# Patient Record
Sex: Male | Born: 1975 | Race: White | Hispanic: No | Marital: Single | Smoking: Former smoker
Health system: Southern US, Community
[De-identification: ages and names within clinical notes are randomized; demographics above are authoritative.]

## PROBLEM LIST (undated history)

## (undated) DIAGNOSIS — F1911 Other psychoactive substance abuse, in remission: Secondary | ICD-10-CM

## (undated) HISTORY — PX: PNEUMONECTOMY: SHX168

## (undated) HISTORY — PX: ORIF DISTAL RADIUS FRACTURE: SUR927

## (undated) HISTORY — PX: FACIAL RECONSTRUCTION SURGERY: SHX631

---
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
## 2015-07-02 DIAGNOSIS — Z22322 Carrier or suspected carrier of Methicillin resistant Staphylococcus aureus: Secondary | ICD-10-CM

## 2015-07-02 HISTORY — DX: Carrier or suspected carrier of methicillin resistant Staphylococcus aureus: Z22.322

## 2020-03-22 ENCOUNTER — Ambulatory Visit
Admission: EM | Admit: 2020-03-22 | Discharge: 2020-03-22 | Disposition: A | Discharge status: Home / Self Care | Payer: Self-pay | Attending: Emergency Medicine | Admitting: Emergency Medicine

## 2020-03-22 ENCOUNTER — Other Ambulatory Visit: Payer: Self-pay

## 2020-03-22 ENCOUNTER — Ambulatory Visit (INDEPENDENT_AMBULATORY_CARE_PROVIDER_SITE_OTHER): Payer: Self-pay

## 2020-03-22 ENCOUNTER — Encounter: Payer: Self-pay | Admitting: Emergency Medicine

## 2020-03-22 DIAGNOSIS — S61432A Puncture wound without foreign body of left hand, initial encounter: Secondary | ICD-10-CM

## 2020-03-22 DIAGNOSIS — W298XXA Contact with other powered powered hand tools and household machinery, initial encounter: Secondary | ICD-10-CM

## 2020-03-22 DIAGNOSIS — M79642 Pain in left hand: Secondary | ICD-10-CM

## 2020-03-22 DIAGNOSIS — R2232 Localized swelling, mass and lump, left upper limb: Secondary | ICD-10-CM

## 2020-03-22 HISTORY — DX: Other psychoactive substance abuse, in remission: F19.11

## 2020-03-22 MED ORDER — IBUPROFEN 600 MG PO TABS: 600.0000 mg | ORAL_TABLET | Freq: Four times a day (QID) | ORAL | 0 refills | Status: DC | PRN

## 2020-03-22 MED ORDER — CEFTRIAXONE SODIUM 1 G IJ SOLR
1.0000 g | Freq: Once | INTRAMUSCULAR | Status: AC
  Administered 2020-03-22: 1 g via INTRAMUSCULAR

## 2020-03-22 MED ORDER — AMOXICILLIN-POT CLAVULANATE 875-125 MG PO TABS: 1.0000 | ORAL_TABLET | Freq: Two times a day (BID) | ORAL | 0 refills | Status: AC

## 2020-03-22 NOTE — Discharge Instructions (Addendum)
Your x-ray was negative for retained foreign body, however you clearly have an infection in the tissue.  I unsure how deep it goes.  I have given you a gram of Rocephin and we are going to try Augmentin which is an antibiotic.  May take 600 mg of ibuprofen combined with 1000 mg of Tylenol 3-4 times a day as needed for pain, please follow-up with Dr. Amanda Pea or any one of the hand surgeons at [facility] in several days if it does not get significantly better.  This may need to be incised and drained.

## 2020-03-22 NOTE — ED Provider Notes (Signed)
HPI  SUBJECTIVE:  Sean Rodriguez is a right-handed 45 y.o. male who presents with left webspace erythema, edema, pain between the thumb and left index finger starting 3 weeks ago.  States that he was drilling metal, the drill broke and went into his hand.  He reports a foreign body sensation, and a hard "knot" in this area.  No fevers, body aches, erythema streaking up the hand.  He reports limitation of motion of the thumb and index finger.  He tried opening it up 1 week ago, and got out pus.  He applied Neosporin and kept it covered.  This helped his symptoms.  He discontinued the antibiotic ointment and dressing 4 days ago, and his hand became swollen and red again.  It became painful 2 to 3 days ago.  States his symptoms feel worse than they did initially.  Symptoms are worse with palpation.  Tetanus is up-to-date.  Past medical history negative for diabetes, hypertension, smoking.  He has a past medical history of polysubstance abuse, has been clean for 4 months.  PMD: None.    Past Medical History:  Diagnosis Date  . History of substance abuse (HCC)     History reviewed. No pertinent surgical history.  History reviewed. No pertinent family history.  Social History   Tobacco Use  . Smoking status: Never Smoker  . Smokeless tobacco: Never Used  Substance Use Topics  . Alcohol use: Not Currently  . Drug use: Not Currently    No current facility-administered medications for this encounter.  Current Outpatient Medications:  .  amoxicillin-clavulanate (AUGMENTIN) 875-125 MG tablet, Take 1 tablet by mouth 2 (two) times daily for 7 days., Disp: 14 tablet, Rfl: 0 .  ibuprofen (ADVIL) 600 MG tablet, Take 1 tablet (600 mg total) by mouth every 6 (six) hours as needed., Disp: 30 tablet, Rfl: 0  No Known Allergies   ROS  As noted in HPI.   Physical Exam  BP 125/75 (BP Location: Left Arm)   Pulse 65   Temp 97.6 F (36.4 C) (Oral)   Resp 18   SpO2 97%   Constitutional: Well  developed, well nourished, no acute distress Eyes:  EOMI, conjunctiva normal bilaterally HENT: Normocephalic, atraumatic,mucus membranes moist Respiratory: Normal inspiratory effort Cardiovascular: Normal rate GI: nondistended skin: No rash, skin intact Musculoskeletal: Tender erythema, edema, firm mass in the webspace between the left thumb and index finger.  No tenderness along the palm of the hand or thenar eminence.  No erythema streaking up the hand.  No tenderness over the thumb, index finger.  Patient able to oppose thumb without any problem.       Neurologic: Alert & oriented x 3, no focal neuro deficits Psychiatric: Speech and behavior appropriate   ED Course   Medications  cefTRIAXone (ROCEPHIN) injection 1 g (1 g Intramuscular Given 03/22/20 1040)    Orders Placed This Encounter  Procedures  . DG Hand Complete Left    Standing Status:   Standing    Number of Occurrences:   1    Order Specific Question:   Reason for Exam (SYMPTOM  OR DIAGNOSIS REQUIRED)    Answer:   Function wound/infection in webspace between thumb and index finger-rule out retained foreign body    No results found for this or any previous visit (from the past 24 hour(s)). DG Hand Complete Left  Result Date: 03/22/2020 CLINICAL DATA:  Penetrating injury with drill bit 3 weeks ago. Pain, swelling, and drainage. Initial encounter. EXAM: LEFT HAND -  COMPLETE 3+ VIEW COMPARISON:  None. FINDINGS: There is no evidence of fracture or dislocation. There is no evidence of arthropathy or other focal bone abnormality. No evidence of radiopaque foreign body or soft tissue gas. IMPRESSION: Negative.  No evidence of fracture or radiopaque foreign body. Electronically Signed   By: Danae Orleans M.D.   On: 03/22/2020 10:55    ED Clinical Impression  1. Puncture wound of left hand without foreign body, initial encounter      ED Assessment/Plan  X-ray hands rule out retained foreign body.  Giving a gram of  Rocephin here.  Reviewed imaging independently and discussed with radiology.  No evidence of radiopaque foreign body or soft tissue gas.  See radiology report for full details.  Patient with cellulitis, possible abscess of the left hand.  Do not feel comfortable opening up and exploring.  Home with Augmentin, ibuprofen, follow-up with Dr. Amanda Pea or any of the hand surgeons in [facility] ASAP.    Discussed imaging, MDM, treatment plan, and plan for follow-up with patient. Discussed sn/sx that should prompt return to the ED. patient agrees with plan.   Meds ordered this encounter  Medications  . cefTRIAXone (ROCEPHIN) injection 1 g  . amoxicillin-clavulanate (AUGMENTIN) 875-125 MG tablet    Sig: Take 1 tablet by mouth 2 (two) times daily for 7 days.    Dispense:  14 tablet    Refill:  0  . ibuprofen (ADVIL) 600 MG tablet    Sig: Take 1 tablet (600 mg total) by mouth every 6 (six) hours as needed.    Dispense:  30 tablet    Refill:  0    *This clinic note was created using Scientist, clinical (histocompatibility and immunogenetics). Therefore, there may be occasional mistakes despite careful proofreading.   ?    Domenick Gong, MD 03/23/20 0900

## 2020-03-22 NOTE — ED Triage Notes (Signed)
Pt sts drill bit went into left hand 3 weeks ago; unsure if metal is still in hand but having some swelling pain and drainage per pt; pt is in sober living program

## 2020-05-15 ENCOUNTER — Other Ambulatory Visit: Payer: Self-pay

## 2020-05-15 ENCOUNTER — Encounter (HOSPITAL_COMMUNITY): Payer: Self-pay | Admitting: Emergency Medicine

## 2020-05-15 ENCOUNTER — Emergency Department (HOSPITAL_COMMUNITY): Payer: Self-pay

## 2020-05-15 ENCOUNTER — Emergency Department (HOSPITAL_COMMUNITY)
Admission: EM | Admit: 2020-05-15 | Discharge: 2020-05-15 | Disposition: A | Discharge status: Home / Self Care | Payer: Self-pay | Attending: Emergency Medicine | Admitting: Emergency Medicine

## 2020-05-15 DIAGNOSIS — S82892A Other fracture of left lower leg, initial encounter for closed fracture: Secondary | ICD-10-CM

## 2020-05-15 DIAGNOSIS — S82842A Displaced bimalleolar fracture of left lower leg, initial encounter for closed fracture: Secondary | ICD-10-CM | POA: Insufficient documentation

## 2020-05-15 DIAGNOSIS — Z8781 Personal history of (healed) traumatic fracture: Secondary | ICD-10-CM

## 2020-05-15 DIAGNOSIS — W19XXXA Unspecified fall, initial encounter: Secondary | ICD-10-CM | POA: Insufficient documentation

## 2020-05-15 DIAGNOSIS — Y9368 Activity, volleyball (beach) (court): Secondary | ICD-10-CM | POA: Insufficient documentation

## 2020-05-15 MED ORDER — HYDROMORPHONE HCL 1 MG/ML IJ SOLN
1.0000 mg | Freq: Once | INTRAMUSCULAR | Status: AC
  Administered 2020-05-15: 1 mg via INTRAVENOUS
  Filled 2020-05-15: qty 1

## 2020-05-15 MED ORDER — TRAMADOL HCL 50 MG PO TABS: 50.0000 mg | ORAL_TABLET | Freq: Four times a day (QID) | ORAL | 0 refills | Status: DC | PRN

## 2020-05-15 MED ORDER — PROPOFOL 10 MG/ML IV BOLUS
INTRAVENOUS | Status: AC | PRN
  Administered 2020-05-15: 20 mg via INTRAVENOUS
  Administered 2020-05-15: 30 mg via INTRAVENOUS
  Administered 2020-05-15: 20 mg via INTRAVENOUS

## 2020-05-15 MED ORDER — FENTANYL CITRATE (PF) 100 MCG/2ML IJ SOLN: 50.0000 ug | Freq: Once | INTRAMUSCULAR | Status: DC

## 2020-05-15 MED ORDER — FENTANYL CITRATE (PF) 100 MCG/2ML IJ SOLN
INTRAMUSCULAR | Status: AC
  Filled 2020-05-15: qty 2

## 2020-05-15 MED ORDER — PROPOFOL 10 MG/ML IV BOLUS
100.0000 mg | Freq: Once | INTRAVENOUS | Status: AC
  Administered 2020-05-15: 60 mg via INTRAVENOUS
  Filled 2020-05-15: qty 20

## 2020-05-15 MED ORDER — FENTANYL CITRATE (PF) 100 MCG/2ML IJ SOLN
INTRAMUSCULAR | Status: AC | PRN
  Administered 2020-05-15: 50 ug via INTRAVENOUS

## 2020-05-15 NOTE — ED Provider Notes (Signed)
[facility]-EMERGENCY DEPT Provider Note   CSN: [id number] Arrival date & time: 05/15/20  1824     History Chief Complaint  Patient presents with  . Ankle Injury    Sean Rodriguez is a 45 y.o. male.  45 year old male presents with left ankle injury after playing volleyball today.  Deformity noted to the ankle.  No distal numbness or tingling to his left foot.  EMS called and patient placed in a splint.  He was given 100 mcg of fentanyl followed by 18.3 mg of ketamine with slight relief.  No other injuries noted        Past Medical History:  Diagnosis Date  . History of substance abuse (HCC)     There are no problems to display for this patient.   History reviewed. No pertinent surgical history.     No family history on file.  Social History   Tobacco Use  . Smoking status: Never Smoker  . Smokeless tobacco: Never Used  Substance Use Topics  . Alcohol use: Not Currently  . Drug use: Not Currently    Home Medications Prior to Admission medications   Medication Sig Start Date End Date Taking? Authorizing Provider  ibuprofen (ADVIL) 600 MG tablet Take 1 tablet (600 mg total) by mouth every 6 (six) hours as needed. 03/22/20   Domenick Gong, MD    Allergies    Patient has no known allergies.  Review of Systems   Review of Systems  All other systems reviewed and are negative.   Physical Exam Updated Vital Signs BP 124/85   Pulse 79   Temp 98.4 F (36.9 C) (Oral)   Resp 18   Ht 1.905 m (6\' 3" )   Wt 90.7 kg   SpO2 100%   BMI 25.00 kg/m   Physical Exam Vitals and nursing note reviewed.  Constitutional:      General: He is not in acute distress.    Appearance: Normal appearance. He is well-developed. He is not toxic-appearing.  HENT:     Head: Normocephalic and atraumatic.  Eyes:     General: Lids are normal.     Conjunctiva/sclera: Conjunctivae normal.     Pupils: Pupils are equal, round, and reactive to light.  Neck:      Thyroid: No thyroid mass.     Trachea: No tracheal deviation.  Cardiovascular:     Rate and Rhythm: Normal rate and regular rhythm.     Heart sounds: Normal heart sounds. No murmur heard. No gallop.   Pulmonary:     Effort: Pulmonary effort is normal. No respiratory distress.     Breath sounds: Normal breath sounds. No stridor. No decreased breath sounds, wheezing, rhonchi or rales.  Abdominal:     General: Bowel sounds are normal. There is no distension.     Palpations: Abdomen is soft.     Tenderness: There is no abdominal tenderness. There is no rebound.  Musculoskeletal:     Cervical back: Normal range of motion and neck supple.     Left ankle: Swelling and deformity present. No lacerations. Tenderness present. Decreased range of motion.  Skin:    General: Skin is warm and dry.     Findings: No abrasion or rash.  Neurological:     Mental Status: He is alert and oriented to person, place, and time.     GCS: GCS eye subscore is 4. GCS verbal subscore is 5. GCS motor subscore is 6.     Cranial Nerves: No  cranial nerve deficit.     Sensory: No sensory deficit.  Psychiatric:        Speech: Speech normal.        Behavior: Behavior normal.     ED Results / Procedures / Treatments   Labs (all labs ordered are listed, but only abnormal results are displayed) Labs Reviewed - No data to display  EKG None  Radiology No results found.  Procedures Reduction of dislocation  Date/Time: 05/15/2020 7:46 PM Performed by: Lorre Nick, MD Authorized by: Lorre Nick, MD  Consent: Written consent obtained. Risks and benefits: risks, benefits and alternatives were discussed Consent given by: patient Time out: Immediately prior to procedure a "time out" was called to verify the correct patient, procedure, equipment, support staff and site/side marked as required. Preparation: Patient was prepped and draped in the usual sterile fashion.  Sedation: Patient sedated:  yes Sedatives: propofol Analgesia: fentanyl Sedation start date/time: 05/15/2020 7:30 PM Sedation end date/time: 05/15/2020 7:47 PM Vitals: Vital signs were monitored during sedation.  Patient tolerance: patient tolerated the procedure well with no immediate complications  .Sedation  Date/Time: 05/15/2020 7:48 PM Performed by: Lorre Nick, MD Authorized by: Lorre Nick, MD   Consent:    Consent obtained:  Written   Consent given by:  Patient   Risks discussed:  Allergic reaction and dysrhythmia Universal protocol:    Immediately prior to procedure, a time out was called: yes     Patient identity confirmed:  Anonymous protocol, patient vented/unresponsive Pre-sedation assessment:    Time since last food or drink:  4 hours   NPO status caution: urgency dictates proceeding with non-ideal NPO status     ASA classification: class 1 - normal, healthy patient     Mouth opening:  3 or more finger widths   Mallampati score:  I - soft palate, uvula, fauces, pillars visible   Neck mobility: normal     Pre-sedation assessments completed and reviewed: airway patency, cardiovascular function, hydration status, mental status, nausea/vomiting, pain level and respiratory function     Pre-sedation assessment completed:  05/15/2020 7:25 PM Immediate pre-procedure details:    Reassessment: Patient reassessed immediately prior to procedure     Reviewed: vital signs     Verified: bag valve mask available, emergency equipment available, oxygen available and suction available   Procedure details (see MAR for exact dosages):    Preoxygenation:  Nasal cannula   Sedation:  Propofol   Intended level of sedation: deep   Analgesia:  Fentanyl   Intra-procedure monitoring:  Blood pressure monitoring, cardiac monitor and frequent LOC assessments   Intra-procedure events: none     Total Provider sedation time (minutes):  20 Post-procedure details:    Post-sedation assessment completed:  05/15/2020 7:49  PM   Attendance: Constant attendance by certified staff until patient recovered     Recovery: Patient returned to pre-procedure baseline     Patient is stable for discharge or admission: yes     Procedure completion:  Tolerated well, no immediate complications     Medications Ordered in ED Medications  HYDROmorphone (DILAUDID) injection 1 mg (has no administration in time range)  propofol (DIPRIVAN) 10 mg/mL bolus/IV push 100 mg (has no administration in time range)    ED Course  I have reviewed the triage vital signs and the nursing notes.  Pertinent labs & imaging results that were available during my care of the patient were reviewed by me and considered in my medical decision making (see chart for details).  MDM Rules/Calculators/A&P                          Patient's initial x-rays show fracture dislocation of his left ankle.  He was sedated as above and ankle was relocated.  Postreduction x-rays show good alignment and much improved mortise.  Films reviewed by orthopedics surgeon on-call, Dr. Charlann Boxer, who requested CT of ankle and requested patient call the office tomorrow to be seen by Dr. Victorino Dike on Wednesday.  Patient is in addiction recovery house and will prescribe tramadol for his discomfort.  Final Clinical Impression(s) / ED Diagnoses Final diagnoses:  None    Rx / DC Orders ED Discharge Orders    None       Lorre Nick, MD 05/15/20 2104

## 2020-05-15 NOTE — Sedation Documentation (Signed)
Successful closed reduction of left ankle by MD Freida Busman

## 2020-05-15 NOTE — Discharge Instructions (Signed)
Call the orthopedist tomorrow to schedule an appointment to be seen this Wednesday

## 2020-05-15 NOTE — Progress Notes (Signed)
Orthopedic Tech Progress Note Patient Details:  Sean Rodriguez 08-Jan-1975 [id number]  Ortho Devices Type of Ortho Device: Post (short leg) splint Ortho Device/Splint Location: LLE Ortho Device/Splint Interventions: Ordered,Application   Post Interventions Patient Tolerated: Fair Instructions Provided: Adjustment of device,Care of device   [employer] 05/15/2020, 7:49 PM

## 2020-05-15 NOTE — ED Triage Notes (Signed)
Pt was playing volleyball, fell and injured his L ankle. EMS splinted ankle on scene. Pt received 100 mcg Fentanyl w/o relief. Pt was given 18.3 mg Ketamine that provided relief. Pt denies hitting head, LOC or other injury. Pt is A&Ox4 upon arrival. Pt has 18g RAC.  EMS VS 152/87, HR 99, RR16, ETCo2 24, 98.7.

## 2020-05-19 ENCOUNTER — Other Ambulatory Visit: Payer: Self-pay

## 2020-05-19 ENCOUNTER — Encounter (HOSPITAL_BASED_OUTPATIENT_CLINIC_OR_DEPARTMENT_OTHER): Payer: Self-pay | Admitting: Orthopedic Surgery

## 2020-05-19 DIAGNOSIS — F1911 Other psychoactive substance abuse, in remission: Secondary | ICD-10-CM

## 2020-05-19 HISTORY — DX: Other psychoactive substance abuse, in remission: F19.11

## 2020-05-23 ENCOUNTER — Other Ambulatory Visit (HOSPITAL_COMMUNITY): Payer: Self-pay | Admitting: Orthopedic Surgery

## 2020-05-26 ENCOUNTER — Ambulatory Visit (HOSPITAL_BASED_OUTPATIENT_CLINIC_OR_DEPARTMENT_OTHER): Payer: Self-pay | Admitting: Anesthesiology

## 2020-05-26 ENCOUNTER — Other Ambulatory Visit: Payer: Self-pay

## 2020-05-26 ENCOUNTER — Ambulatory Visit (HOSPITAL_BASED_OUTPATIENT_CLINIC_OR_DEPARTMENT_OTHER)
Admission: RE | Admit: 2020-05-26 | Discharge: 2020-05-26 | Disposition: A | Discharge status: Home / Self Care | Payer: Self-pay | Attending: Orthopedic Surgery | Admitting: Orthopedic Surgery

## 2020-05-26 ENCOUNTER — Encounter (HOSPITAL_BASED_OUTPATIENT_CLINIC_OR_DEPARTMENT_OTHER)
Admission: RE | Disposition: A | Discharge status: Home / Self Care | Payer: Self-pay | Source: Home / Self Care | Attending: Orthopedic Surgery

## 2020-05-26 ENCOUNTER — Encounter (HOSPITAL_BASED_OUTPATIENT_CLINIC_OR_DEPARTMENT_OTHER): Payer: Self-pay | Admitting: Orthopedic Surgery

## 2020-05-26 DIAGNOSIS — X501XXA Overexertion from prolonged static or awkward postures, initial encounter: Secondary | ICD-10-CM | POA: Insufficient documentation

## 2020-05-26 DIAGNOSIS — S82842A Displaced bimalleolar fracture of left lower leg, initial encounter for closed fracture: Secondary | ICD-10-CM | POA: Insufficient documentation

## 2020-05-26 DIAGNOSIS — Y9368 Activity, volleyball (beach) (court): Secondary | ICD-10-CM | POA: Insufficient documentation

## 2020-05-26 HISTORY — PX: ORIF ANKLE FRACTURE: SHX5408

## 2020-05-26 SURGERY — OPEN REDUCTION INTERNAL FIXATION (ORIF) ANKLE FRACTURE
Anesthesia: General | Site: Ankle | Laterality: Left

## 2020-05-26 MED ORDER — OXYCODONE HCL 5 MG/5ML PO SOLN
5.0000 mg | Freq: Once | ORAL | Status: AC | PRN
Start: 2020-05-26 — End: 2020-05-26

## 2020-05-26 MED ORDER — ONDANSETRON HCL 4 MG/2ML IJ SOLN
INTRAMUSCULAR | Status: DC | PRN
  Administered 2020-05-26: 4 mg via INTRAVENOUS

## 2020-05-26 MED ORDER — CEFAZOLIN SODIUM-DEXTROSE 2-4 GM/100ML-% IV SOLN
INTRAVENOUS | Status: AC
  Filled 2020-05-26: qty 100

## 2020-05-26 MED ORDER — PROPOFOL 10 MG/ML IV BOLUS
INTRAVENOUS | Status: DC | PRN
  Administered 2020-05-26: 150 mg via INTRAVENOUS
  Administered 2020-05-26: 50 mg via INTRAVENOUS

## 2020-05-26 MED ORDER — ACETAMINOPHEN 325 MG PO TABS: 650.0000 mg | ORAL_TABLET | Freq: Four times a day (QID) | ORAL | 0 refills | Status: AC | PRN

## 2020-05-26 MED ORDER — DEXMEDETOMIDINE (PRECEDEX) IN NS 20 MCG/5ML (4 MCG/ML) IV SYRINGE
PREFILLED_SYRINGE | INTRAVENOUS | Status: DC | PRN
Start: 2020-05-26 — End: 2020-05-26
  Administered 2020-05-26 (×5): 4 ug via INTRAVENOUS

## 2020-05-26 MED ORDER — MELOXICAM 7.5 MG PO TABS: 7.5000 mg | ORAL_TABLET | Freq: Two times a day (BID) | ORAL | 0 refills | Status: AC | PRN

## 2020-05-26 MED ORDER — DEXMEDETOMIDINE (PRECEDEX) IN NS 20 MCG/5ML (4 MCG/ML) IV SYRINGE
PREFILLED_SYRINGE | INTRAVENOUS | Status: AC
  Filled 2020-05-26: qty 5

## 2020-05-26 MED ORDER — FENTANYL CITRATE (PF) 100 MCG/2ML IJ SOLN
INTRAMUSCULAR | Status: AC
  Filled 2020-05-26: qty 2

## 2020-05-26 MED ORDER — VANCOMYCIN HCL 500 MG IV SOLR
INTRAVENOUS | Status: AC
  Filled 2020-05-26: qty 500

## 2020-05-26 MED ORDER — ACETAMINOPHEN 325 MG PO TABS
325.0000 mg | ORAL_TABLET | ORAL | Status: DC | PRN
Start: 2020-05-26 — End: 2020-05-26

## 2020-05-26 MED ORDER — FENTANYL CITRATE (PF) 100 MCG/2ML IJ SOLN
INTRAMUSCULAR | Status: DC | PRN
  Administered 2020-05-26: 100 ug via INTRAVENOUS

## 2020-05-26 MED ORDER — DEXAMETHASONE SODIUM PHOSPHATE 10 MG/ML IJ SOLN
INTRAMUSCULAR | Status: DC | PRN
  Administered 2020-05-26: 10 mg via INTRAVENOUS

## 2020-05-26 MED ORDER — SODIUM CHLORIDE 0.9 % IV SOLN: INTRAVENOUS | Status: DC

## 2020-05-26 MED ORDER — FENTANYL CITRATE (PF) 100 MCG/2ML IJ SOLN
100.0000 ug | Freq: Once | INTRAMUSCULAR | Status: AC
Start: 2020-05-26 — End: 2020-05-26
  Administered 2020-05-26: 100 ug via INTRAVENOUS

## 2020-05-26 MED ORDER — ACETAMINOPHEN 10 MG/ML IV SOLN
1000.0000 mg | Freq: Once | INTRAVENOUS | Status: AC
  Administered 2020-05-26: 1000 mg via INTRAVENOUS

## 2020-05-26 MED ORDER — OXYCODONE HCL 5 MG PO TABS
ORAL_TABLET | ORAL | Status: AC
  Filled 2020-05-26: qty 1

## 2020-05-26 MED ORDER — EPHEDRINE SULFATE 50 MG/ML IJ SOLN
INTRAMUSCULAR | Status: DC | PRN
  Administered 2020-05-26 (×2): 5 mg via INTRAVENOUS

## 2020-05-26 MED ORDER — MIDAZOLAM HCL 2 MG/2ML IJ SOLN
2.0000 mg | Freq: Once | INTRAMUSCULAR | Status: AC
  Administered 2020-05-26: 2 mg via INTRAVENOUS

## 2020-05-26 MED ORDER — VANCOMYCIN HCL 500 MG IV SOLR
INTRAVENOUS | Status: DC | PRN
  Administered 2020-05-26: 500 mg via TOPICAL

## 2020-05-26 MED ORDER — FENTANYL CITRATE (PF) 100 MCG/2ML IJ SOLN
25.0000 ug | INTRAMUSCULAR | Status: DC | PRN
  Administered 2020-05-26 (×3): 50 ug via INTRAVENOUS

## 2020-05-26 MED ORDER — LACTATED RINGERS IV SOLN: INTRAVENOUS | Status: DC

## 2020-05-26 MED ORDER — EPHEDRINE 5 MG/ML INJ
INTRAVENOUS | Status: AC
  Filled 2020-05-26: qty 10

## 2020-05-26 MED ORDER — KETOROLAC TROMETHAMINE 30 MG/ML IJ SOLN
INTRAMUSCULAR | Status: DC | PRN
  Administered 2020-05-26: 30 mg via INTRAVENOUS

## 2020-05-26 MED ORDER — ACETAMINOPHEN 10 MG/ML IV SOLN
INTRAVENOUS | Status: AC
  Filled 2020-05-26: qty 100

## 2020-05-26 MED ORDER — BUPIVACAINE-EPINEPHRINE (PF) 0.5% -1:200000 IJ SOLN
INTRAMUSCULAR | Status: DC | PRN
  Administered 2020-05-26: 15 mL via PERINEURAL

## 2020-05-26 MED ORDER — CEFAZOLIN SODIUM-DEXTROSE 2-4 GM/100ML-% IV SOLN
2.0000 g | INTRAVENOUS | Status: AC
  Administered 2020-05-26: 2 g via INTRAVENOUS

## 2020-05-26 MED ORDER — LIDOCAINE HCL (CARDIAC) PF 100 MG/5ML IV SOSY
PREFILLED_SYRINGE | INTRAVENOUS | Status: DC | PRN
  Administered 2020-05-26: 40 mg via INTRAVENOUS

## 2020-05-26 MED ORDER — ACETAMINOPHEN 160 MG/5ML PO SOLN
325.0000 mg | ORAL | Status: DC | PRN
Start: 2020-05-26 — End: 2020-05-26

## 2020-05-26 MED ORDER — MEPERIDINE HCL 25 MG/ML IJ SOLN: 6.2500 mg | INTRAMUSCULAR | Status: DC | PRN

## 2020-05-26 MED ORDER — MIDAZOLAM HCL 2 MG/2ML IJ SOLN
INTRAMUSCULAR | Status: AC
  Filled 2020-05-26: qty 2

## 2020-05-26 MED ORDER — OXYCODONE HCL 5 MG PO TABS
5.0000 mg | ORAL_TABLET | Freq: Once | ORAL | Status: AC | PRN
  Administered 2020-05-26: 5 mg via ORAL

## 2020-05-26 MED ORDER — 0.9 % SODIUM CHLORIDE (POUR BTL) OPTIME
TOPICAL | Status: DC | PRN
  Administered 2020-05-26: 120 mL

## 2020-05-26 MED ORDER — BUPIVACAINE LIPOSOME 1.3 % IJ SUSP
INTRAMUSCULAR | Status: DC | PRN
  Administered 2020-05-26: 10 mL via PERINEURAL

## 2020-05-26 MED ORDER — ONDANSETRON HCL 4 MG/2ML IJ SOLN: 4.0000 mg | Freq: Once | INTRAMUSCULAR | Status: DC | PRN

## 2020-05-26 SURGICAL SUPPLY — 82 items
APL PRP STRL LF DISP 70% ISPRP (MISCELLANEOUS) ×1
BANDAGE ESMARK 6X9 LF (GAUZE/BANDAGES/DRESSINGS) IMPLANT
BIT DRILL 2.5X2.75 QC CALB (BIT) ×2 IMPLANT
BLADE SURG 15 STRL LF DISP TIS (BLADE) ×2 IMPLANT
BLADE SURG 15 STRL SS (BLADE) ×4
BNDG CMPR 9X4 STRL LF SNTH (GAUZE/BANDAGES/DRESSINGS)
BNDG CMPR 9X6 STRL LF SNTH (GAUZE/BANDAGES/DRESSINGS)
BNDG COHESIVE 4X5 TAN STRL (GAUZE/BANDAGES/DRESSINGS) ×2 IMPLANT
BNDG COHESIVE 6X5 TAN STRL LF (GAUZE/BANDAGES/DRESSINGS) ×2 IMPLANT
BNDG ESMARK 4X9 LF (GAUZE/BANDAGES/DRESSINGS) IMPLANT
BNDG ESMARK 6X9 LF (GAUZE/BANDAGES/DRESSINGS)
CANISTER SUCT 1200ML W/VALVE (MISCELLANEOUS) ×2 IMPLANT
CHLORAPREP W/TINT 26 (MISCELLANEOUS) ×2 IMPLANT
COVER BACK TABLE 60X90IN (DRAPES) ×2 IMPLANT
COVER WAND RF STERILE (DRAPES) IMPLANT
CUFF TOURN SGL QUICK 34 (TOURNIQUET CUFF) ×2
CUFF TRNQT CYL 34X4.125X (TOURNIQUET CUFF) ×1 IMPLANT
DECANTER SPIKE VIAL GLASS SM (MISCELLANEOUS) IMPLANT
DRAPE EXTREMITY T 121X128X90 (DISPOSABLE) ×2 IMPLANT
DRAPE OEC MINIVIEW 54X84 (DRAPES) ×2 IMPLANT
DRAPE U-SHAPE 47X51 STRL (DRAPES) ×2 IMPLANT
DRSG MEPITEL 4X7.2 (GAUZE/BANDAGES/DRESSINGS) ×2 IMPLANT
DRSG PAD ABDOMINAL 8X10 ST (GAUZE/BANDAGES/DRESSINGS) ×4 IMPLANT
ELECT REM PT RETURN 9FT ADLT (ELECTROSURGICAL) ×2
ELECTRODE REM PT RTRN 9FT ADLT (ELECTROSURGICAL) ×1 IMPLANT
GAUZE SPONGE 4X4 12PLY STRL (GAUZE/BANDAGES/DRESSINGS) ×2 IMPLANT
GLOVE SRG 8 PF TXTR STRL LF DI (GLOVE) ×1 IMPLANT
GLOVE SURG ENC MOIS LTX SZ8 (GLOVE) ×2 IMPLANT
GLOVE SURG LTX SZ8 (GLOVE) IMPLANT
GLOVE SURG POLYISO LF SZ7 (GLOVE) ×2 IMPLANT
GLOVE SURG UNDER POLY LF SZ7 (GLOVE) ×2 IMPLANT
GLOVE SURG UNDER POLY LF SZ8 (GLOVE) ×2
GOWN STRL REUS W/ TWL LRG LVL3 (GOWN DISPOSABLE) ×1 IMPLANT
GOWN STRL REUS W/ TWL XL LVL3 (GOWN DISPOSABLE) ×1 IMPLANT
GOWN STRL REUS W/TWL LRG LVL3 (GOWN DISPOSABLE) ×2
GOWN STRL REUS W/TWL XL LVL3 (GOWN DISPOSABLE) ×2
NEEDLE HYPO 22GX1.5 SAFETY (NEEDLE) IMPLANT
NS IRRIG 1000ML POUR BTL (IV SOLUTION) ×2 IMPLANT
PACK BASIN DAY SURGERY FS (CUSTOM PROCEDURE TRAY) ×2 IMPLANT
PAD CAST 4YDX4 CTTN HI CHSV (CAST SUPPLIES) ×1 IMPLANT
PADDING CAST ABS 4INX4YD NS (CAST SUPPLIES)
PADDING CAST ABS COTTON 4X4 ST (CAST SUPPLIES) IMPLANT
PADDING CAST COTTON 4X4 STRL (CAST SUPPLIES) ×2
PADDING CAST COTTON 6X4 STRL (CAST SUPPLIES) ×2 IMPLANT
PENCIL SMOKE EVACUATOR (MISCELLANEOUS) ×2 IMPLANT
PLATE ACE 100DEG 7HOLE (Plate) ×2 IMPLANT
PLATE ACE 3.5MM 2HOLE (Plate) ×2 IMPLANT
SANITIZER HAND PURELL 535ML FO (MISCELLANEOUS) ×2 IMPLANT
SCREW ACE CAN 4.0 44M (Screw) ×2 IMPLANT
SCREW CORTICAL 3.5MM  16MM (Screw) ×2 IMPLANT
SCREW CORTICAL 3.5MM  20MM (Screw) ×1 IMPLANT
SCREW CORTICAL 3.5MM  34MM (Screw) ×1 IMPLANT
SCREW CORTICAL 3.5MM 14MM (Screw) ×4 IMPLANT
SCREW CORTICAL 3.5MM 16MM (Screw) ×2 IMPLANT
SCREW CORTICAL 3.5MM 18MM (Screw) ×2 IMPLANT
SCREW CORTICAL 3.5MM 20MM (Screw) ×1 IMPLANT
SCREW CORTICAL 3.5MM 34MM (Screw) ×1 IMPLANT
SCREW CORTICAL 3.5MM 38MM (Screw) ×2 IMPLANT
SHEET MEDIUM DRAPE 40X70 STRL (DRAPES) ×2 IMPLANT
SLEEVE SCD COMPRESS KNEE MED (STOCKING) ×2 IMPLANT
SPLINT FAST PLASTER 5X30 (CAST SUPPLIES) ×20
SPLINT PLASTER CAST FAST 5X30 (CAST SUPPLIES) ×20 IMPLANT
SPONGE LAP 18X18 RF (DISPOSABLE) ×2 IMPLANT
STAPLER VISISTAT 35W (STAPLE) ×2 IMPLANT
STOCKINETTE 6  STRL (DRAPES) ×1
STOCKINETTE 6 STRL (DRAPES) ×1 IMPLANT
SUCTION FRAZIER HANDLE 10FR (MISCELLANEOUS) ×1
SUCTION TUBE FRAZIER 10FR DISP (MISCELLANEOUS) ×1 IMPLANT
SUT ETHILON 3 0 PS 1 (SUTURE) IMPLANT
SUT FIBERWIRE #2 38 T-5 BLUE (SUTURE)
SUT MNCRL AB 3-0 PS2 18 (SUTURE) IMPLANT
SUT VIC AB 0 SH 27 (SUTURE) IMPLANT
SUT VIC AB 2-0 SH 27 (SUTURE) ×4
SUT VIC AB 2-0 SH 27XBRD (SUTURE) ×2 IMPLANT
SUTURE FIBERWR #2 38 T-5 BLUE (SUTURE) IMPLANT
SYR BULB EAR ULCER 3OZ GRN STR (SYRINGE) ×2 IMPLANT
SYR CONTROL 10ML LL (SYRINGE) IMPLANT
TOWEL GREEN STERILE FF (TOWEL DISPOSABLE) ×4 IMPLANT
TUBE CONNECTING 20X1/4 (TUBING) ×2 IMPLANT
UNDERPAD 30X36 HEAVY ABSORB (UNDERPADS AND DIAPERS) ×2 IMPLANT
WASHER FLAT ACE (Orthopedic Implant) ×1 IMPLANT
WASHER PLAIN FLAT ACE NS 3PK (Orthopedic Implant) ×1 IMPLANT

## 2020-05-26 NOTE — Discharge Instructions (Addendum)
,Toni Arthurs, MD [facility]  Please read the following information regarding your care after surgery.  Medications  You only need a prescription for the narcotic pain medicine (ex. oxycodone, Percocet, Norco).  All of the other medicines listed below are available over the counter. X Meloxicam twice a day for the first 3 days after surgery then as needed. X acetominophen (Tylenol) 650 mg every 4-6 hours as you need for minor to moderate pain  Narcotic pain medicine (ex. oxycodone, Percocet, Vicodin) will cause constipation.  To prevent this problem, take the following medicines while you are taking any pain medicine. ? docusate sodium (Colace) 100 mg twice a day ? senna (Senokot) 2 tablets twice a day  X To help prevent blood clots, take a baby aspirin (81 mg) twice a day for two weeks after surgery.  You should also get up every hour while you are awake to move around.    Weight Bearing ? Bear weight when you are able on your operated leg or foot. ? Bear weight only on your operated foot in the post-op shoe. X Do not bear any weight on the operated leg or foot.  Cast / Splint / Dressing X Keep your splint, cast or dressing clean and dry.  Don't put anything (coat hanger, pencil, etc) down inside of it.  If it gets damp, use a hair dryer on the cool setting to dry it.  If it gets soaked, call the office to schedule an appointment for a cast change. ? Remove your dressing 3 days after surgery and cover the incisions with dry dressings.    After your dressing, cast or splint is removed; you may shower, but do not soak or scrub the wound.  Allow the water to run over it, and then gently pat it dry.  Swelling It is normal for you to have swelling where you had surgery.  To reduce swelling and pain, keep your toes above your nose for at least 3 days after surgery.  It may be necessary to keep your foot or leg elevated for several weeks.  If it hurts, it should be elevated.  Follow Up Call  my office at [phone] when you are discharged from the hospital or surgery center to schedule an appointment to be seen two weeks after surgery.  Call my office at [phone] if you develop a fever >101.5 F, nausea, vomiting, bleeding from the surgical site or severe pain.    May take NSAIDS (Ibuprofen, Motrin) after 10:20pm, if needed.    Post Anesthesia Home Care Instructions  Activity: Get plenty of rest for the remainder of the day. A responsible individual must stay with you for 24 hours following the procedure.  For the next 24 hours, DO NOT: -Drive a car -Advertising copywriter -Drink alcoholic beverages -Take any medication unless instructed by your physician -Make any legal decisions or sign important papers.  Meals: Start with liquid foods such as gelatin or soup. Progress to regular foods as tolerated. Avoid greasy, spicy, heavy foods. If nausea and/or vomiting occur, drink only clear liquids until the nausea and/or vomiting subsides. Call your physician if vomiting continues.  Special Instructions/Symptoms: Your throat may feel dry or sore from the anesthesia or the breathing tube placed in your throat during surgery. If this causes discomfort, gargle with warm salt water. The discomfort should disappear within 24 hours.  If you had a scopolamine patch placed behind your ear for the management of post- operative nausea and/or vomiting:  1. The  medication in the patch is effective for 72 hours, after which it should be removed.  Wrap patch in a tissue and discard in the trash. Wash hands thoroughly with soap and water. 2. You may remove the patch earlier than 72 hours if you experience unpleasant side effects which may include dry mouth, dizziness or visual disturbances. 3. Avoid touching the patch. Wash your hands with soap and water after contact with the patch.     Information for Discharge Teaching: EXPAREL (bupivacaine liposome injectable suspension)   Your surgeon  or anesthesiologist gave you EXPAREL(bupivacaine) to help control your pain after surgery.   EXPAREL is a local anesthetic that provides pain relief by numbing the tissue around the surgical site.  EXPAREL is designed to release pain medication over time and can control pain for up to 72 hours.  Depending on how you respond to EXPAREL, you may require less pain medication during your recovery.  Possible side effects:  Temporary loss of sensation or ability to move in the area where bupivacaine was injected.  Nausea, vomiting, constipation  Rarely, numbness and tingling in your mouth or lips, lightheadedness, or anxiety may occur.  Call your doctor right away if you think you may be experiencing any of these sensations, or if you have other questions regarding possible side effects.  Follow all other discharge instructions given to you by your surgeon or nurse. Eat a healthy diet and drink plenty of water or other fluids.  If you return to the hospital for any reason within 96 hours following the administration of EXPAREL, it is important for health care providers to know that you have received this anesthetic. A teal colored band has been placed on your arm with the date, time and amount of EXPAREL you have received in order to alert and inform your health care providers. Please leave this armband in place for the full 96 hours following administration, and then you may remove the band.

## 2020-05-26 NOTE — Anesthesia Procedure Notes (Signed)
Procedure Name: LMA Insertion Performed by: Kevan Prouty S, CRNA Pre-anesthesia Checklist: Patient identified, Emergency Drugs available, Suction available and Patient being monitored Patient Re-evaluated:Patient Re-evaluated prior to induction Oxygen Delivery Method: Circle System Utilized Preoxygenation: Pre-oxygenation with 100% oxygen Induction Type: IV induction Ventilation: Mask ventilation without difficulty LMA: LMA inserted LMA Size: 4.0 Number of attempts: 1 Airway Equipment and Method: Bite block Placement Confirmation: positive ETCO2 Tube secured with: Tape Dental Injury: Teeth and Oropharynx as per pre-operative assessment        

## 2020-05-26 NOTE — Anesthesia Procedure Notes (Signed)
Anesthesia Regional Block: Popliteal block   Pre-Anesthetic Checklist: ,, timeout performed, Correct Patient, Correct Site, Correct Laterality, Correct Procedure, Correct Position, site marked, Risks and benefits discussed,  Surgical consent,  Pre-op evaluation,  At surgeon's request and post-op pain management  Laterality: Left  Prep: chloraprep       Needles:  Injection technique: Single-shot  Needle Type: Echogenic Stimulator Needle     Needle Length: 5cm  Needle Gauge: 22     Additional Needles:   Procedures:, nerve stimulator,,, ultrasound used (permanent image in chart),,,,   Nerve Stimulator or Paresthesia:  Response: foot, 0.45 mA,   Additional Responses:   Narrative:  Start time: 05/26/2020 1:10 PM End time: 05/26/2020 1:15 PM Injection made incrementally with aspirations every 5 mL.  Performed by: Personally  Anesthesiologist: Bethena Midget, MD  Additional Notes: Functioning IV was confirmed and monitors were applied.  A 35mm 22ga Arrow echogenic stimulator needle was used. Sterile prep and drape,hand hygiene and sterile gloves were used. Ultrasound guidance: relevant anatomy identified, needle position confirmed, local anesthetic spread visualized around nerve(s)., vascular puncture avoided.  Image printed for medical record. Negative aspiration and negative test dose prior to incremental administration of local anesthetic. The patient tolerated the procedure well.

## 2020-05-26 NOTE — Op Note (Signed)
05/26/2020  4:37 PM  PATIENT:  Sean Rodriguez  45 y.o. male  PRE-OPERATIVE DIAGNOSIS:  Left displaced bimalleolar ankle fracture  POST-OPERATIVE DIAGNOSIS:  Left displaced bimalleolar ankle fracture  Procedure(s):  1.  Open treatment of left ankle bimalleolar fracture with internal fixation 2.  Stress examination of the left ankle under fluoroscopy 3.  AP, lateral and mortise radiographs of the left ankle  SURGEON:  Toni Arthurs, MD  ASSISTANT: None  ANESTHESIA:   General, regional  EBL:  minimal   TOURNIQUET:   Total Tourniquet Time Documented: Thigh (Left) - 42 minutes Total: Thigh (Left) - 42 minutes  COMPLICATIONS:  None apparent  DISPOSITION:  Extubated, awake and stable to recovery.  INDICATION FOR PROCEDURE: The patient is a 45 year old male without significant past medical history.  He injured his left ankle playing volleyball just over a week ago.  He has a displaced bimalleolar fracture that is unstable.  He presents now for operative treatment.  The risks and benefits of the alternative treatment options have been discussed in detail.  The patient wishes to proceed with surgery and specifically understands risks of bleeding, infection, nerve damage, blood clots, need for additional surgery, amputation and death.  PROCEDURE IN DETAIL:  After pre operative consent was obtained, and the correct operative site was identified, the patient was brought to the operating room and placed supine on the OR table.  Anesthesia was administered.  Pre-operative antibiotics were administered.  A surgical timeout was taken.  The left lower extremity was prepped and draped in standard sterile fashion with a tourniquet around the thigh.  The extremity was elevated and the tourniquet was inflated to 250 mmHg.  A longitudinal incision was made over the lateral malleolus.  Dissection was carried down through the subcutaneous tissues.  The fracture site was identified.  It was cleaned of all  hematoma and irrigated.  The fracture was reduced and held with a pointed tenaculum.  A 7 hole one third tubular plate from the [employer] titanium small frag set was selected.  It was contoured to fit the lateral malleolus.  It was secured distally with 3 unicortical screws.  Proximally it was secured with 3 bicortical screws.  AP and lateral radiographs confirmed appropriate reduction of the fracture and appropriate position and length of the hardware.  Attention was turned to the medial malleolus.  A curvilinear incision was made over the fracture site.  Dissection was carried down through the subcutaneous tissues.  Care was taken to protect the saphenous nerve and vein.  The fracture site was cleaned of all hematoma and periosteum.  The fracture was reduced and provisionally held with a K wire.  A one third tubular plate was placed over the apex of the fracture as a buttress.  This was a 2 hole plate.  It was secured proximal to the fracture line with a fully threaded 3.5 millimeter screw.  This compressed the fracture site appropriately.  The second screw was placed through the distal hole in the plate and was again noted to have excellent purchase.  The K wire was then overdrilled and a 4 mm partially-threaded cannulated screw with a washer was inserted.  It was noted to have excellent purchase as well.  AP, mortise and lateral radiographs confirmed appropriate position and length of all hardware and appropriate reduction of the fractures.  Stress examination was performed.  There was no instability evident at the syndesmosis.  The wounds were irrigated and sprinkled with vancomycin powder.  Subcutaneous  tissues were approximated with Vicryl.  Skin incisions were closed with staples.  Sterile dressings were applied followed by well-padded short leg splint.  The tourniquet was released after application of the dressings.  The patient was awakened from anesthesia and transported to the recovery room in  stable condition.  FOLLOW UP PLAN: Nonweightbearing on the left lower extremity.  Follow-up in the office in 2 weeks for suture removal and conversion to a short leg cast.  Plan 6 weeks postoperative nonweightbearing immobilization.   RADIOGRAPHS: AP, mortise and lateral radiographs of the left ankle are obtained intraoperatively.  These show interval reduction and fixation of the bimalleolar ankle fracture.  Hardware is appropriately positioned and of the appropriate lengths.  No other acute injuries are noted.
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
## 2020-05-26 NOTE — Anesthesia Preprocedure Evaluation (Addendum)
Anesthesia Evaluation  Patient identified by MRN, date of birth, ID band Patient awake    Reviewed: Allergy & Precautions, H&P , NPO status , Patient's Chart, lab work & pertinent test results, reviewed documented beta blocker date and time   Airway Mallampati: II  TM Distance: >3 FB Neck ROM: full    Dental no notable dental hx. (+) Teeth Intact, Dental Advisory Given, Caps, Chipped,    Pulmonary neg pulmonary ROS,    Pulmonary exam normal breath sounds clear to auscultation       Cardiovascular Exercise Tolerance: Good negative cardio ROS   Rhythm:regular Rate:Normal     Neuro/Psych negative neurological ROS  negative psych ROS   GI/Hepatic negative GI ROS, (+)     substance abuse  , H/o substance abuse,  Now in clean living home     Endo/Other  negative endocrine ROS  Renal/GU negative Renal ROS  negative genitourinary   Musculoskeletal   Abdominal   Peds  Hematology negative hematology ROS (+)   Anesthesia Other Findings   Reproductive/Obstetrics negative OB ROS                            Anesthesia Physical Anesthesia Plan  ASA: II  Anesthesia Plan: General   Post-op Pain Management: GA combined w/ Regional for post-op pain   Induction: Intravenous  PONV Risk Score and Plan: 2 and Dexamethasone  Airway Management Planned: Oral ETT and LMA  Additional Equipment: None  Intra-op Plan:   Post-operative Plan: Extubation in OR  Informed Consent: I have reviewed the patients History and Physical, chart, labs and discussed the procedure including the risks, benefits and alternatives for the proposed anesthesia with the patient or authorized representative who has indicated his/her understanding and acceptance.     Dental Advisory Given  Plan Discussed with: CRNA and Anesthesiologist  Anesthesia Plan Comments: (Discussed both nerve block for pain relief post-op and GA;  including NV, sore throat, dental injury, and pulmonary complications  Block with  intra-op non narcotics)       Anesthesia Quick Evaluation

## 2020-05-26 NOTE — Progress Notes (Signed)
Assisted Dr. Audley Hose. Oddono with left, ultrasound guided, popliteal block. Side rails up, monitors on throughout procedure. See vital signs in flow sheet. Tolerated Procedure well.

## 2020-05-26 NOTE — Transfer of Care (Signed)
Immediate Anesthesia Transfer of Care Note  Patient: Sean Rodriguez  Procedure(s) Performed: Open Reduction Internal Fixation (ORIF) Left ankle bimalleolar fracture (Left Ankle)  Patient Location: PACU  Anesthesia Type:General and Regional  Level of Consciousness: drowsy  Airway & Oxygen Therapy: Patient Spontanous Breathing and Patient connected to face mask oxygen  Post-op Assessment: Report given to RN and Post -op Vital signs reviewed and stable  Post vital signs: Reviewed and stable  Last Vitals:  Vitals Value Taken Time  BP 106/58 05/26/20 1634  Temp    Pulse 74 05/26/20 1635  Resp 15 05/26/20 1635  SpO2 97 % 05/26/20 1635  Vitals shown include unvalidated device data.  Last Pain:  Vitals:   05/26/20 1229  TempSrc: Oral  PainSc: 7       Patients Stated Pain Goal: 4 (05/26/20 1229)  Complications: No complications documented.

## 2020-05-27 ENCOUNTER — Encounter (HOSPITAL_BASED_OUTPATIENT_CLINIC_OR_DEPARTMENT_OTHER): Payer: Self-pay | Admitting: Orthopedic Surgery

## 2020-05-27 NOTE — Anesthesia Postprocedure Evaluation (Signed)
Anesthesia Post Note  Patient: Sean Rodriguez  Procedure(s) Performed: Open Reduction Internal Fixation (ORIF) Left ankle bimalleolar fracture (Left Ankle)     Patient location during evaluation: PACU Anesthesia Type: General and Regional Level of consciousness: awake and alert Pain management: pain level controlled Vital Signs Assessment: post-procedure vital signs reviewed and stable Respiratory status: spontaneous breathing, nonlabored ventilation, respiratory function stable and patient connected to nasal cannula oxygen Cardiovascular status: blood pressure returned to baseline and stable Postop Assessment: no apparent nausea or vomiting Anesthetic complications: no   No complications documented.  Last Vitals:  Vitals:   05/26/20 1715 05/26/20 1820  BP: (!) 126/94 (!) 129/91  Pulse: 73 81  Resp: 16 14  Temp:  36.5 C  SpO2: 97% 99%    Last Pain:  Vitals:   05/26/20 1820  TempSrc:   PainSc: 5    Pain Goal: Patients Stated Pain Goal: 3 (05/26/20 1640)                 Maziyah Vessel

## 2020-08-24 ENCOUNTER — Other Ambulatory Visit: Payer: Self-pay

## 2020-08-24 ENCOUNTER — Other Ambulatory Visit (HOSPITAL_COMMUNITY): Payer: Self-pay | Admitting: Orthopedic Surgery

## 2020-08-24 ENCOUNTER — Encounter (HOSPITAL_BASED_OUTPATIENT_CLINIC_OR_DEPARTMENT_OTHER): Payer: Self-pay | Admitting: Orthopedic Surgery

## 2020-08-25 ENCOUNTER — Ambulatory Visit (HOSPITAL_BASED_OUTPATIENT_CLINIC_OR_DEPARTMENT_OTHER)
Admission: RE | Admit: 2020-08-25 | Discharge: 2020-08-25 | Disposition: A | Discharge status: Home / Self Care | Payer: Self-pay | Attending: Orthopedic Surgery | Admitting: Orthopedic Surgery

## 2020-08-25 ENCOUNTER — Ambulatory Visit (HOSPITAL_BASED_OUTPATIENT_CLINIC_OR_DEPARTMENT_OTHER): Payer: Self-pay | Admitting: Certified Registered"

## 2020-08-25 ENCOUNTER — Encounter (HOSPITAL_BASED_OUTPATIENT_CLINIC_OR_DEPARTMENT_OTHER)
Admission: RE | Disposition: A | Discharge status: Home / Self Care | Payer: Self-pay | Source: Home / Self Care | Attending: Orthopedic Surgery

## 2020-08-25 ENCOUNTER — Other Ambulatory Visit: Payer: Self-pay

## 2020-08-25 ENCOUNTER — Encounter (HOSPITAL_BASED_OUTPATIENT_CLINIC_OR_DEPARTMENT_OTHER): Payer: Self-pay | Admitting: Orthopedic Surgery

## 2020-08-25 DIAGNOSIS — Z8614 Personal history of Methicillin resistant Staphylococcus aureus infection: Secondary | ICD-10-CM | POA: Insufficient documentation

## 2020-08-25 DIAGNOSIS — L03116 Cellulitis of left lower limb: Secondary | ICD-10-CM | POA: Insufficient documentation

## 2020-08-25 DIAGNOSIS — X58XXXA Exposure to other specified factors, initial encounter: Secondary | ICD-10-CM | POA: Insufficient documentation

## 2020-08-25 DIAGNOSIS — F17200 Nicotine dependence, unspecified, uncomplicated: Secondary | ICD-10-CM | POA: Insufficient documentation

## 2020-08-25 DIAGNOSIS — T8141XA Infection following a procedure, superficial incisional surgical site, initial encounter: Secondary | ICD-10-CM | POA: Insufficient documentation

## 2020-08-25 HISTORY — PX: INCISION AND DRAINAGE OF WOUND: SHX1803

## 2020-08-25 HISTORY — PX: HARDWARE REMOVAL: SHX979

## 2020-08-25 SURGERY — IRRIGATION AND DEBRIDEMENT WOUND
Anesthesia: General | Site: Ankle | Laterality: Left

## 2020-08-25 MED ORDER — CEFAZOLIN SODIUM-DEXTROSE 2-3 GM-%(50ML) IV SOLR
INTRAVENOUS | Status: DC | PRN
  Administered 2020-08-25: 2 g via INTRAVENOUS

## 2020-08-25 MED ORDER — DEXMEDETOMIDINE (PRECEDEX) IN NS 20 MCG/5ML (4 MCG/ML) IV SYRINGE
PREFILLED_SYRINGE | INTRAVENOUS | Status: DC | PRN
  Administered 2020-08-25: 8 ug via INTRAVENOUS

## 2020-08-25 MED ORDER — PROPOFOL 500 MG/50ML IV EMUL
INTRAVENOUS | Status: DC | PRN
  Administered 2020-08-25: 25 ug/kg/min via INTRAVENOUS

## 2020-08-25 MED ORDER — PROPOFOL 10 MG/ML IV BOLUS
INTRAVENOUS | Status: DC | PRN
  Administered 2020-08-25: 200 mg via INTRAVENOUS

## 2020-08-25 MED ORDER — FENTANYL CITRATE (PF) 100 MCG/2ML IJ SOLN
100.0000 ug | Freq: Once | INTRAMUSCULAR | Status: AC
  Administered 2020-08-25: 100 ug via INTRAVENOUS

## 2020-08-25 MED ORDER — VANCOMYCIN HCL 500 MG IV SOLR
INTRAVENOUS | Status: DC | PRN
  Administered 2020-08-25: 500 mg via TOPICAL

## 2020-08-25 MED ORDER — PROMETHAZINE HCL 25 MG/ML IJ SOLN: 6.2500 mg | INTRAMUSCULAR | Status: DC | PRN

## 2020-08-25 MED ORDER — MIDAZOLAM HCL 2 MG/2ML IJ SOLN
INTRAMUSCULAR | Status: AC
  Filled 2020-08-25: qty 2

## 2020-08-25 MED ORDER — MEPERIDINE HCL 25 MG/ML IJ SOLN: 6.2500 mg | INTRAMUSCULAR | Status: DC | PRN

## 2020-08-25 MED ORDER — ROPIVACAINE HCL 5 MG/ML IJ SOLN
INTRAMUSCULAR | Status: DC | PRN
  Administered 2020-08-25: 30 mL via PERINEURAL

## 2020-08-25 MED ORDER — DEXAMETHASONE SODIUM PHOSPHATE 10 MG/ML IJ SOLN
INTRAMUSCULAR | Status: DC | PRN
  Administered 2020-08-25: 4 mg via INTRAVENOUS

## 2020-08-25 MED ORDER — MIDAZOLAM HCL 2 MG/2ML IJ SOLN
2.0000 mg | Freq: Once | INTRAMUSCULAR | Status: AC
  Administered 2020-08-25: 2 mg via INTRAVENOUS

## 2020-08-25 MED ORDER — LACTATED RINGERS IV SOLN: INTRAVENOUS | Status: DC

## 2020-08-25 MED ORDER — OXYCODONE HCL 5 MG PO TABS: 5.0000 mg | ORAL_TABLET | Freq: Once | ORAL | Status: DC | PRN

## 2020-08-25 MED ORDER — DOXYCYCLINE HYCLATE 50 MG PO CAPS: 50.0000 mg | ORAL_CAPSULE | Freq: Two times a day (BID) | ORAL | 0 refills | Status: AC

## 2020-08-25 MED ORDER — HYDROMORPHONE HCL 1 MG/ML IJ SOLN: 0.2500 mg | INTRAMUSCULAR | Status: DC | PRN

## 2020-08-25 MED ORDER — ONDANSETRON HCL 4 MG/2ML IJ SOLN
INTRAMUSCULAR | Status: DC | PRN
  Administered 2020-08-25: 4 mg via INTRAVENOUS

## 2020-08-25 MED ORDER — 0.9 % SODIUM CHLORIDE (POUR BTL) OPTIME
TOPICAL | Status: DC | PRN
  Administered 2020-08-25: 200 mL

## 2020-08-25 MED ORDER — FENTANYL CITRATE (PF) 100 MCG/2ML IJ SOLN
INTRAMUSCULAR | Status: AC
  Filled 2020-08-25: qty 2

## 2020-08-25 MED ORDER — SODIUM CHLORIDE 0.9 % IV SOLN: INTRAVENOUS | Status: DC

## 2020-08-25 MED ORDER — OXYCODONE HCL 5 MG/5ML PO SOLN
5.0000 mg | Freq: Once | ORAL | Status: DC | PRN
Start: 2020-08-25 — End: 2020-08-25

## 2020-08-25 MED ORDER — AMISULPRIDE (ANTIEMETIC) 5 MG/2ML IV SOLN: 10.0000 mg | Freq: Once | INTRAVENOUS | Status: DC | PRN

## 2020-08-25 MED ORDER — SODIUM CHLORIDE 0.9 % IR SOLN
Status: DC | PRN
  Administered 2020-08-25: 3000 mL

## 2020-08-25 SURGICAL SUPPLY — 66 items
APL PRP STRL LF DISP 70% ISPRP (MISCELLANEOUS) ×1
BANDAGE ESMARK 6X9 LF (GAUZE/BANDAGES/DRESSINGS) IMPLANT
BLADE SURG 15 STRL LF DISP TIS (BLADE) ×2 IMPLANT
BLADE SURG 15 STRL SS (BLADE) ×4
BNDG CMPR 9X4 STRL LF SNTH (GAUZE/BANDAGES/DRESSINGS)
BNDG CMPR 9X6 STRL LF SNTH (GAUZE/BANDAGES/DRESSINGS)
BNDG COHESIVE 3X5 TAN STRL LF (GAUZE/BANDAGES/DRESSINGS) IMPLANT
BNDG COHESIVE 4X5 TAN ST LF (GAUZE/BANDAGES/DRESSINGS) ×2 IMPLANT
BNDG COHESIVE 6X5 TAN ST LF (GAUZE/BANDAGES/DRESSINGS) ×2 IMPLANT
BNDG ELASTIC 4X5.8 VLCR STR LF (GAUZE/BANDAGES/DRESSINGS) IMPLANT
BNDG ESMARK 4X9 LF (GAUZE/BANDAGES/DRESSINGS) IMPLANT
BNDG ESMARK 6X9 LF (GAUZE/BANDAGES/DRESSINGS)
BNDG GAUZE ELAST 4 BULKY (GAUZE/BANDAGES/DRESSINGS) IMPLANT
CANISTER SUCT 1200ML W/VALVE (MISCELLANEOUS) ×2 IMPLANT
CHLORAPREP W/TINT 26 (MISCELLANEOUS) ×2 IMPLANT
COVER BACK TABLE 60X90IN (DRAPES) ×2 IMPLANT
CUFF TOURN SGL QUICK 24 (TOURNIQUET CUFF)
CUFF TOURN SGL QUICK 34 (TOURNIQUET CUFF)
CUFF TRNQT CYL 24X4X16.5-23 (TOURNIQUET CUFF) IMPLANT
CUFF TRNQT CYL 34X4.125X (TOURNIQUET CUFF) IMPLANT
DRAPE EXTREMITY T 121X128X90 (DISPOSABLE) ×2 IMPLANT
DRAPE INCISE IOBAN 66X45 STRL (DRAPES) IMPLANT
DRAPE SURG 17X23 STRL (DRAPES) IMPLANT
DRAPE U-SHAPE 47X51 STRL (DRAPES) ×2 IMPLANT
DRSG MEPITEL 4X7.2 (GAUZE/BANDAGES/DRESSINGS) ×2 IMPLANT
DRSG PAD ABDOMINAL 8X10 ST (GAUZE/BANDAGES/DRESSINGS) ×4 IMPLANT
ELECT REM PT RETURN 9FT ADLT (ELECTROSURGICAL) ×2
ELECTRODE REM PT RTRN 9FT ADLT (ELECTROSURGICAL) ×1 IMPLANT
GLOVE SRG 8 PF TXTR STRL LF DI (GLOVE) ×1 IMPLANT
GLOVE SURG ENC MOIS LTX SZ8 (GLOVE) ×4 IMPLANT
GLOVE SURG LTX SZ8 (GLOVE) IMPLANT
GLOVE SURG POLYISO LF SZ6.5 (GLOVE) ×2 IMPLANT
GLOVE SURG POLYISO LF SZ7 (GLOVE) ×2 IMPLANT
GLOVE SURG UNDER POLY LF SZ7 (GLOVE) ×6 IMPLANT
GLOVE SURG UNDER POLY LF SZ8 (GLOVE) ×2
GOWN STRL REUS W/ TWL LRG LVL3 (GOWN DISPOSABLE) ×2 IMPLANT
GOWN STRL REUS W/ TWL XL LVL3 (GOWN DISPOSABLE) ×1 IMPLANT
GOWN STRL REUS W/TWL LRG LVL3 (GOWN DISPOSABLE) ×4
GOWN STRL REUS W/TWL XL LVL3 (GOWN DISPOSABLE) ×2
MANIFOLD NEPTUNE II (INSTRUMENTS) ×2 IMPLANT
NEEDLE HYPO 22GX1.5 SAFETY (NEEDLE) IMPLANT
PACK BASIN DAY SURGERY FS (CUSTOM PROCEDURE TRAY) ×2 IMPLANT
PAD CAST 4YDX4 CTTN HI CHSV (CAST SUPPLIES) ×1 IMPLANT
PADDING CAST COTTON 4X4 STRL (CAST SUPPLIES) ×2
PADDING CAST COTTON 6X4 STRL (CAST SUPPLIES) ×2 IMPLANT
PENCIL SMOKE EVACUATOR (MISCELLANEOUS) ×2 IMPLANT
SANITIZER HAND PURELL 535ML FO (MISCELLANEOUS) IMPLANT
SET IRRIG Y TYPE TUR BLADDER L (SET/KITS/TRAYS/PACK) ×2 IMPLANT
SHEET MEDIUM DRAPE 40X70 STRL (DRAPES) ×2 IMPLANT
SPLINT FAST PLASTER 5X30 (CAST SUPPLIES) ×20
SPLINT PLASTER CAST FAST 5X30 (CAST SUPPLIES) ×20 IMPLANT
SPONGE T-LAP 18X18 ~~LOC~~+RFID (SPONGE) ×2 IMPLANT
STOCKINETTE 6  STRL (DRAPES) ×1
STOCKINETTE 6 STRL (DRAPES) ×1 IMPLANT
SUCTION FRAZIER HANDLE 10FR (MISCELLANEOUS) ×1
SUCTION TUBE FRAZIER 10FR DISP (MISCELLANEOUS) ×1 IMPLANT
SUT ETHILON 3 0 PS 1 (SUTURE) ×4 IMPLANT
SUT VIC AB 2-0 SH 27 (SUTURE)
SUT VIC AB 2-0 SH 27XBRD (SUTURE) IMPLANT
SYR BULB EAR ULCER 3OZ GRN STR (SYRINGE) ×2 IMPLANT
SYR CONTROL 10ML LL (SYRINGE) IMPLANT
TOWEL GREEN STERILE FF (TOWEL DISPOSABLE) ×2 IMPLANT
TRAY DSU PREP LF (CUSTOM PROCEDURE TRAY) IMPLANT
TUBE CONNECTING 20X1/4 (TUBING) ×2 IMPLANT
UNDERPAD 30X36 HEAVY ABSORB (UNDERPADS AND DIAPERS) ×2 IMPLANT
YANKAUER SUCT BULB TIP NO VENT (SUCTIONS) ×2 IMPLANT

## 2020-08-25 NOTE — Anesthesia Preprocedure Evaluation (Signed)
Anesthesia Evaluation  Patient identified by MRN, date of birth, ID band Patient awake    Reviewed: Allergy & Precautions, H&P , NPO status , Patient's Chart, lab work & pertinent test results, reviewed documented beta blocker date and time   Airway Mallampati: II  TM Distance: >3 FB Neck ROM: full    Dental no notable dental hx. (+) Teeth Intact, Dental Advisory Given, Caps, Chipped,    Pulmonary neg pulmonary ROS, Current Smoker and Patient abstained from smoking.,    Pulmonary exam normal breath sounds clear to auscultation       Cardiovascular Exercise Tolerance: Good negative cardio ROS   Rhythm:regular Rate:Normal     Neuro/Psych negative neurological ROS  negative psych ROS   GI/Hepatic negative GI ROS, (+)     substance abuse  , H/o substance abuse,  Now in clean living home     Endo/Other  negative endocrine ROS  Renal/GU negative Renal ROS  negative genitourinary   Musculoskeletal   Abdominal   Peds  Hematology negative hematology ROS (+)   Anesthesia Other Findings   Reproductive/Obstetrics negative OB ROS                             Anesthesia Physical  Anesthesia Plan  ASA: II  Anesthesia Plan: General   Post-op Pain Management: GA combined w/ Regional for post-op pain   Induction: Intravenous  PONV Risk Score and Plan: 2 and Ondansetron, Midazolam and Treatment may vary due to age or medical condition  Airway Management Planned: LMA  Additional Equipment: None  Intra-op Plan:   Post-operative Plan: Extubation in OR  Informed Consent: I have reviewed the patients History and Physical, chart, labs and discussed the procedure including the risks, benefits and alternatives for the proposed anesthesia with the patient or authorized representative who has indicated his/her understanding and acceptance.     Dental Advisory Given  Plan Discussed with: CRNA  and Anesthesiologist  Anesthesia Plan Comments: (Discussed both nerve block for pain relief post-op and GA; including NV, sore throat, dental injury, and pulmonary complications  Block with  intra-op non narcotics)        Anesthesia Quick Evaluation

## 2020-08-25 NOTE — Anesthesia Procedure Notes (Signed)
Procedure Name: LMA Insertion Date/Time: 08/25/2020 12:37 PM Performed by: Sheryn Bison, CRNA Pre-anesthesia Checklist: Patient identified, Emergency Drugs available, Suction available and Patient being monitored Patient Re-evaluated:Patient Re-evaluated prior to induction Oxygen Delivery Method: Circle System Utilized Preoxygenation: Pre-oxygenation with 100% oxygen Induction Type: IV induction Ventilation: Mask ventilation without difficulty LMA: LMA inserted LMA Size: 4.0 Number of attempts: 1 Airway Equipment and Method: bite block Placement Confirmation: positive ETCO2 Tube secured with: Tape Dental Injury: Teeth and Oropharynx as per pre-operative assessment

## 2020-08-25 NOTE — Op Note (Addendum)
08/25/2020  1:40 PM  PATIENT:  Sean Rodriguez  45 y.o. male  PRE-OPERATIVE DIAGNOSIS:  left ankle cellulitis s/p internal fixation of bimalleolar fracture  POST-OPERATIVE DIAGNOSIS:  left ankle cellulitis s/p internal fixation of bimalleolar fracture  Procedure(s):  Removal of deep implants left ankle Irrigation and excisional debridement of left ankle wound  SURGEON:  Toni Arthurs, MD  ASSISTANT: none  ANESTHESIA:   General, regional  EBL:  minimal   TOURNIQUET:   Total Tourniquet Time Documented: Thigh (Left) - 25 minutes Total: Thigh (Left) - 25 minutes  COMPLICATIONS:  None apparent  DISPOSITION:  Extubated, awake and stable to recovery.  Debridement type: Excisional Debridement  Side: left  Body Location: ankle    Tools used for debridement: scalpel, scissors, and curette  Pre-debridement Wound size (cm):   Length: 10        Width: 0.5     Depth: 0.5   Post-debridement Wound size (cm):   Length: 10        Width: 1     Depth: 1   Debridement depth beyond dead/damaged tissue down to healthy viable tissue: yes  Tissue layer involved: skin, subcutaneous tissue, muscle / fascia, bone  Nature of tissue removed: Devitalized Tissue, Non-viable tissue, and Purulence  Irrigation volume: 3 L     Irrigation fluid type: Normal Saline    INDICATION FOR PROCEDURE: 45 year old male with past medical history of smoking is now almost 3 months out from ORIF of his left ankle fracture.  He presented to the office yesterday with new onset swelling and erythema of his left lateral ankle incision.  Signs and symptoms are consistent with postoperative wound infection.  He presents now for operative treatment.  He has not taken any antibiotics.The risks and benefits of the alternative treatment options have been discussed in detail.  The patient wishes to proceed with surgery and specifically understands risks of bleeding, infection, nerve damage, blood clots, need for additional  surgery, amputation and death.   PROCEDURE IN DETAIL: After preoperative consent was obtained and the correct operative site was identified, the patient was brought to the operating room and placed upon the operating table.  General anesthesia was administered.  A surgical timeout was taken.  Preoperative antibiotics were held pending intraoperative cultures.  The left lower extremity was prepped and draped in standard sterile fashion with a tourniquet around the thigh.  The extremity was elevated and the tourniquet was inflated to 250 mmHg.  The lateral incision was opened again sharply and dissection carried sharply down through the subcutaneous tissues.  The distal area of the incision with an area of punctate drainage was excised in its entirety.  Deep tissue was taken from around the plate and sent as a specimen to microbiology.  All 6 screws were removed without difficulty followed by the plate.  There was grossly purulent material along the plate nearly its full length but worst distally.  All devitalized and nonviable tissue was sharply excised with a scalpel, rondure and curette.  The wound was then irrigated with 3 L of normal saline.  At this point the wound appeared generally healthy.  The fracture was healed enough to be stable.  500 mg of vancomycin powder was placed in the wound.  Perioperative antibiotics were then administered.  The skin incision was closed with horizontal mattress and retention sutures of 3-0 nylon.  Sterile dressings were applied followed by a compression wrap and a short leg splint.  The tourniquet was released after application  of the dressings.  The patient was awakened from anesthesia and transported to the recovery room in stable condition.   FOLLOW UP PLAN: Nonweightbearing on the left lower extremity.  Follow-up in the office in 2 weeks for suture removal and wound check.  Oral doxycycline for 2 weeks on discharge. Aspirin for DVT prophylaxis.

## 2020-08-25 NOTE — Discharge Instructions (Addendum)
Sean Arthurs, MD [facility]  Please read the following information regarding your care after surgery.  Medications  You only need a prescription for the narcotic pain medicine (ex. oxycodone, Percocet, Norco).  All of the other medicines listed below are available over the counter. X Aleve 2 pills twice a day for the first 3 days after surgery. X acetominophen (Tylenol) 650 mg every 4-6 hours as you need for minor to moderate pain   X To help prevent blood clots, take a baby aspirin (81 mg) twice a day for two weeks after surgery.  You should also get up every hour while you are awake to move around.    Weight Bearing X Do not bear any weight on the operated leg or foot.  Cast / Splint / Dressing X Keep your splint, cast or dressing clean and dry.  Don't put anything (coat hanger, pencil, etc) down inside of it.  If it gets damp, use a hair dryer on the cool setting to dry it.  If it gets soaked, call the office to schedule an appointment for a cast change.   After your dressing, cast or splint is removed; you may shower, but do not soak or scrub the wound.  Allow the water to run over it, and then gently pat it dry.  Swelling It is normal for you to have swelling where you had surgery.  To reduce swelling and pain, keep your toes above your nose for at least 3 days after surgery.  It may be necessary to keep your foot or leg elevated for several weeks.  If it hurts, it should be elevated.  Follow Up Call my office at [phone] when you are discharged from the hospital or surgery center to schedule an appointment to be seen two weeks after surgery.  Call my office at [phone] if you develop a fever >101.5 F, nausea, vomiting, bleeding from the surgical site or severe pain.     Post Anesthesia Home Care Instructions  Activity: Get plenty of rest for the remainder of the day. A responsible individual must stay with you for 24 hours following the procedure.  For the next 24  hours, DO NOT: -Drive a car -Advertising copywriter -Drink alcoholic beverages -Take any medication unless instructed by your physician -Make any legal decisions or sign important papers.  Meals: Start with liquid foods such as gelatin or soup. Progress to regular foods as tolerated. Avoid greasy, spicy, heavy foods. If nausea and/or vomiting occur, drink only clear liquids until the nausea and/or vomiting subsides. Call your physician if vomiting continues.  Special Instructions/Symptoms: Your throat may feel dry or sore from the anesthesia or the breathing tube placed in your throat during surgery. If this causes discomfort, gargle with warm salt water. The discomfort should disappear within 24 hours.  If you had a scopolamine patch placed behind your ear for the management of post- operative nausea and/or vomiting:  1. The medication in the patch is effective for 72 hours, after which it should be removed.  Wrap patch in a tissue and discard in the trash. Wash hands thoroughly with soap and water. 2. You may remove the patch earlier than 72 hours if you experience unpleasant side effects which may include dry mouth, dizziness or visual disturbances. 3. Avoid touching the patch. Wash your hands with soap and water after contact with the patch.    Regional Anesthesia Blocks  1. Numbness or the inability to move the "blocked" extremity may last from 3-48  hours after placement. The length of time depends on the medication injected and your individual response to the medication. If the numbness is not going away after 48 hours, call your surgeon.  2. The extremity that is blocked will need to be protected until the numbness is gone and the  Strength has returned. Because you cannot feel it, you will need to take extra care to avoid injury. Because it may be weak, you may have difficulty moving it or using it. You may not know what position it is in without looking at it while the block is in  effect.  3. For blocks in the legs and feet, returning to weight bearing and walking needs to be done carefully. You will need to wait until the numbness is entirely gone and the strength has returned. You should be able to move your leg and foot normally before you try and bear weight or walk. You will need someone to be with you when you first try to ensure you do not fall and possibly risk injury.  4. Bruising and tenderness at the needle site are common side effects and will resolve in a few days.  5. Persistent numbness or new problems with movement should be communicated to the surgeon or the [facility] [phone] [facility] [phone]).

## 2020-08-25 NOTE — Anesthesia Procedure Notes (Signed)
Anesthesia Regional Block: Popliteal block   Pre-Anesthetic Checklist: , timeout performed,  Correct Patient, Correct Site, Correct Laterality,  Correct Procedure, Correct Position, site marked,  Risks and benefits discussed,  Surgical consent,  Pre-op evaluation,  At surgeon's request and post-op pain management  Laterality: Left  Prep: chloraprep       Needles:  Injection technique: Single-shot  Needle Type: Stimiplex     Needle Length: 9cm  Needle Gauge: 21     Additional Needles:   Procedures:,,,, ultrasound used (permanent image in chart),,    Narrative:  Start time: 08/25/2020 11:28 AM End time: 08/25/2020 11:33 AM Injection made incrementally with aspirations every 5 mL.  Performed by: Personally  Anesthesiologist: Lowella Curb, MD

## 2020-08-25 NOTE — Transfer of Care (Signed)
Immediate Anesthesia Transfer of Care Note  Patient: Reef Achterberg  Procedure(s) Performed: IRRIGATION AND DEBRIDEMENT left ankle, possible removal of deep implants (Left)  Patient Location: PACU  Anesthesia Type:GA combined with regional for post-op pain  Level of Consciousness: awake, alert , oriented and patient cooperative  Airway & Oxygen Therapy: Patient Spontanous Breathing and Patient connected to face mask oxygen  Post-op Assessment: Report given to RN and Post -op Vital signs reviewed and stable  Post vital signs: Reviewed and stable  Last Vitals:  Vitals Value Taken Time  BP 124/106 08/25/20 1327  Temp    Pulse 83 08/25/20 1329  Resp 11 08/25/20 1329  SpO2 100 % 08/25/20 1329  Vitals shown include unvalidated device data.  Last Pain:  Vitals:   08/25/20 0939  TempSrc: Oral  PainSc: 7       Patients Stated Pain Goal: 3 (08/25/20 0939)  Complications: No notable events documented.
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
## 2020-08-25 NOTE — Anesthesia Postprocedure Evaluation (Signed)
Anesthesia Post Note  Patient: Shyam Dawson  Procedure(s) Performed: Irrigation and debridement left ankle (Left: Ankle) Removal of deep implants (Left: Ankle)     Patient location during evaluation: PACU Anesthesia Type: General Level of consciousness: sedated Pain management: pain level controlled Vital Signs Assessment: post-procedure vital signs reviewed and stable Respiratory status: spontaneous breathing and respiratory function stable Cardiovascular status: stable Postop Assessment: no apparent nausea or vomiting Anesthetic complications: no   No notable events documented.  Last Vitals:  Vitals:   08/25/20 1345 08/25/20 1400  BP: (!) 150/91 123/81  Pulse: 71 67  Resp: 13 16  Temp:    SpO2: 95% 96%    Last Pain:  Vitals:   08/25/20 1400  TempSrc:   PainSc: 0-No pain    LLE Motor Response: No movement due to regional block (08/25/20 1400) LLE Sensation: Decreased (08/25/20 1400)          Jentry Mcqueary DANIEL

## 2020-08-25 NOTE — Progress Notes (Signed)
Assisted Dr. Miller with left, ultrasound guided, popliteal block. Side rails up, monitors on throughout procedure. See vital signs in flow sheet. Tolerated Procedure well. 

## 2020-08-26 ENCOUNTER — Encounter (HOSPITAL_BASED_OUTPATIENT_CLINIC_OR_DEPARTMENT_OTHER): Payer: Self-pay | Admitting: Orthopedic Surgery

## 2020-08-30 LAB — AEROBIC/ANAEROBIC CULTURE W GRAM STAIN (SURGICAL/DEEP WOUND)

## 2022-07-16 IMAGING — DX DG ANKLE PORT 2V*L*
2 series · 2 of 2 positions shown · non-contrast
Comparison: Same day ankle radiograph at 2055 hours.

CLINICAL DATA: Status post reduction and splinting of left ankle

EXAM:
PORTABLE LEFT ANKLE - 2 VIEW

[ankle ap]
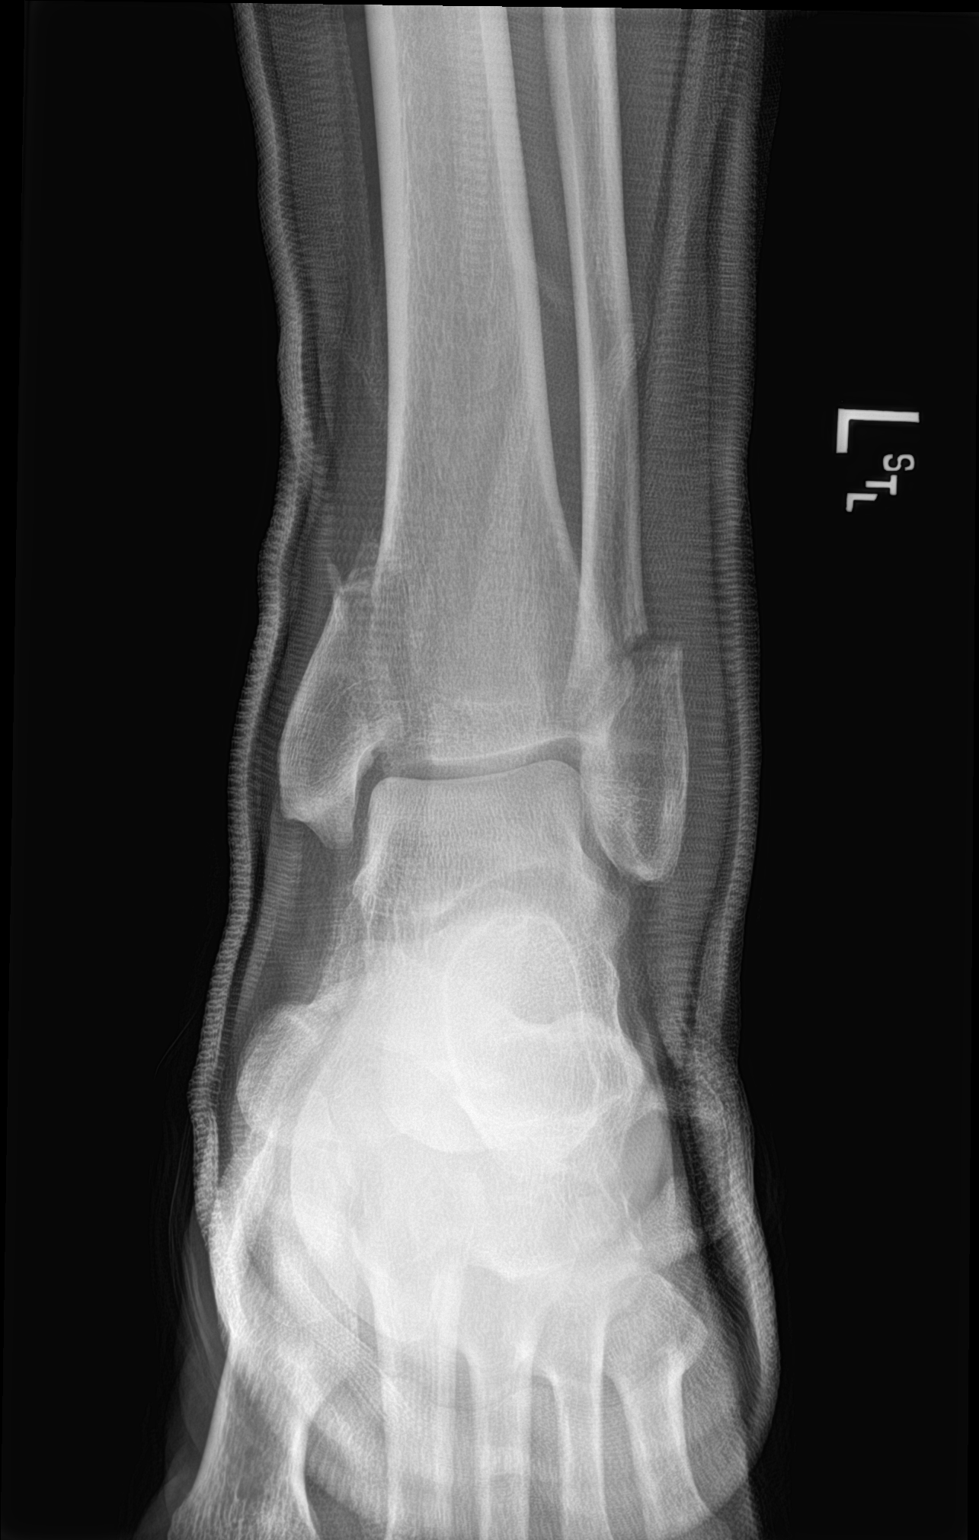

[ankle lat]
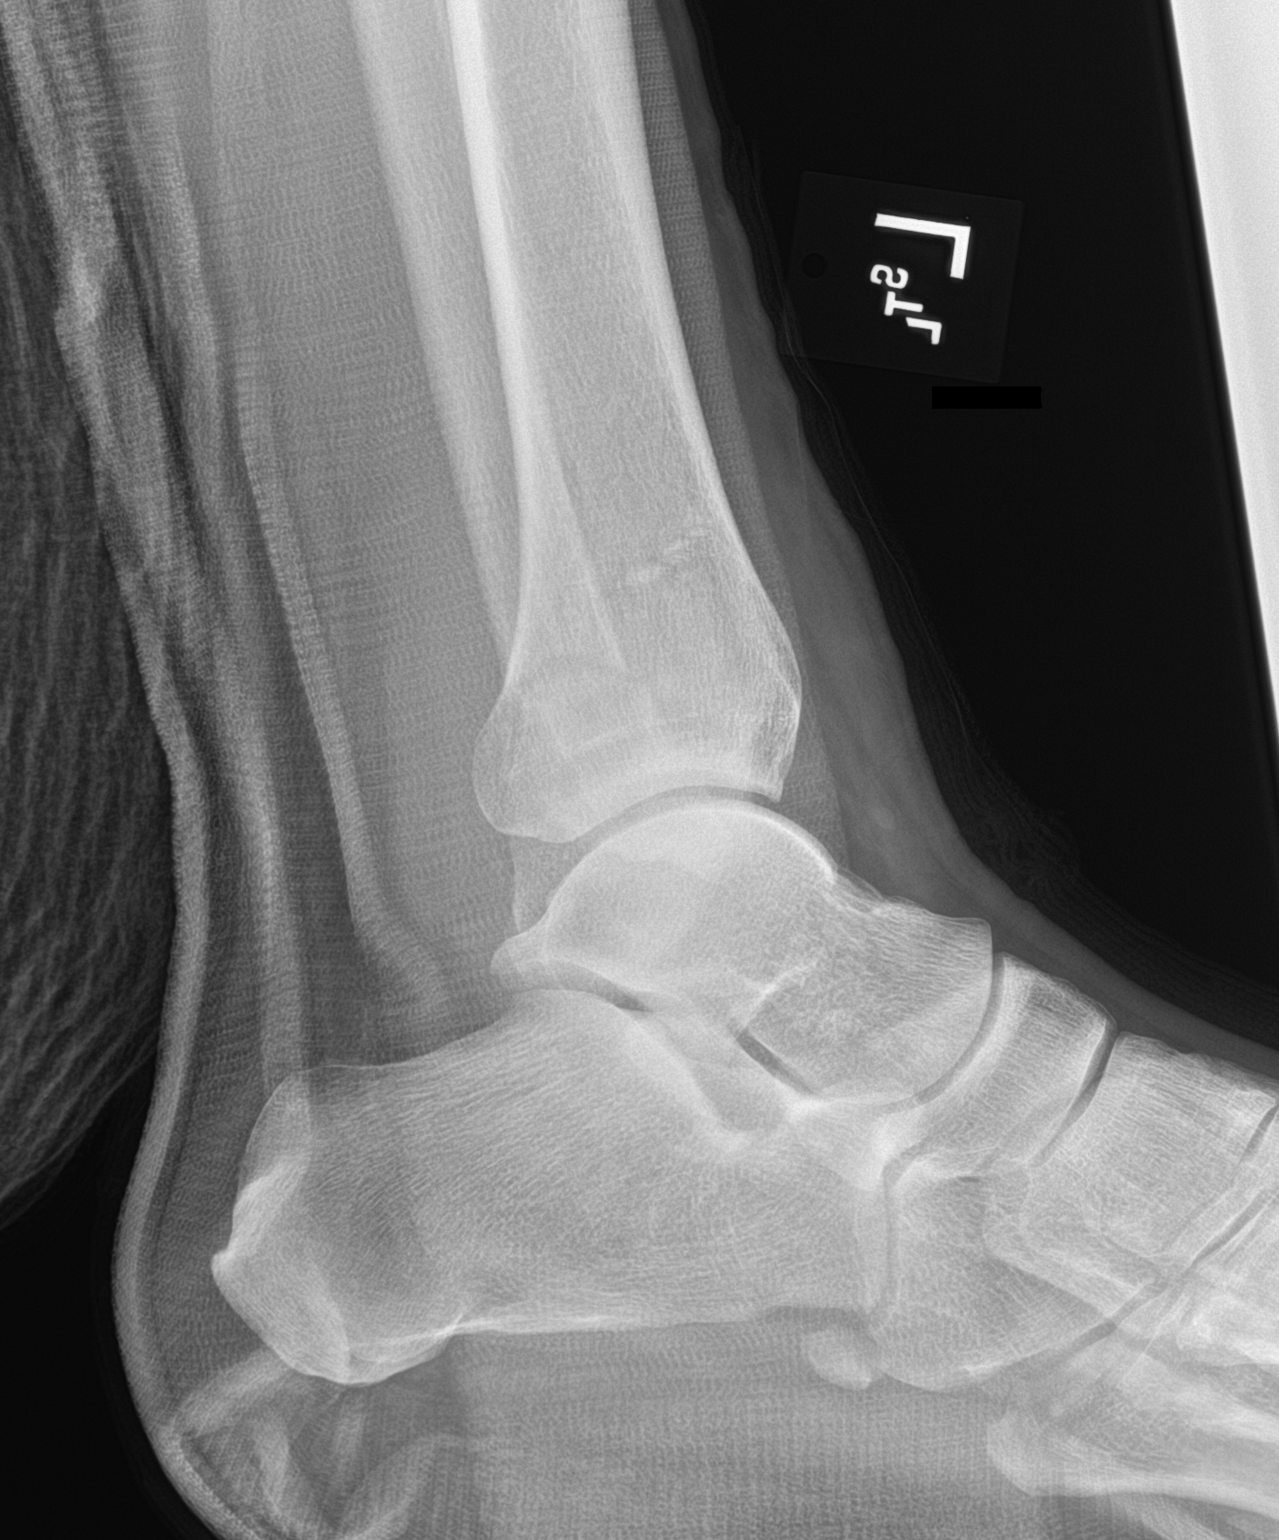

[2 of 2 positions shown; findings below may reference images not displayed]

FINDINGS: Overlying splinting material obscures fine bony and soft-tissue
detail. Interval reduction with improved alignment of the
bimalleolar ankle fracture.
IMPRESSION: Status post reduction and splinting with improved alignment of
bimalleolar ankle fracture

## 2022-07-16 IMAGING — CT CT ANKLE*L* W/O CM
3 series · 15 of 33 positions shown, 18 images · non-contrast
Comparison: Plain radiographs [DATE] p.m.

CLINICAL DATA: Left ankle fracture, post reduction imaging

EXAM:
CT OF THE LEFT ANKLE WITHOUT CONTRAST
TECHNIQUE: Multidetector CT imaging of the left ankle was performed according
to the standard protocol. Multiplanar CT image reconstructions were
also generated.

[Series 4: axial st · axial · 0.39mm/px · z∈[-1188,-1016]mm · 7 of 102 slices shown, 9 images]
[im 8/102  soft-tissue]
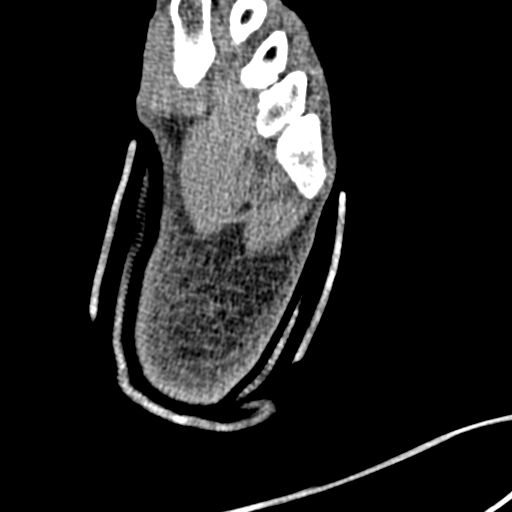
[im 8/102  bone]
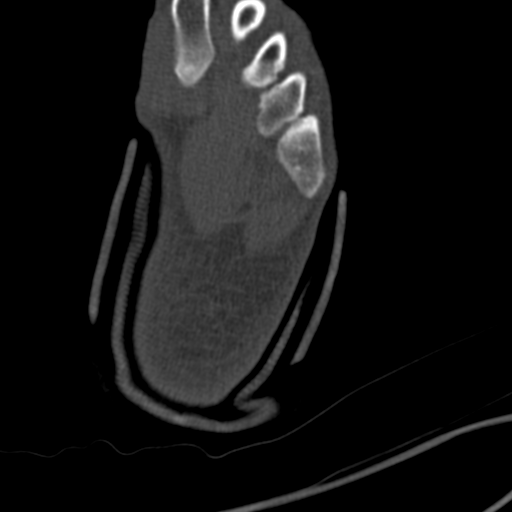
[im 24/102  bone]
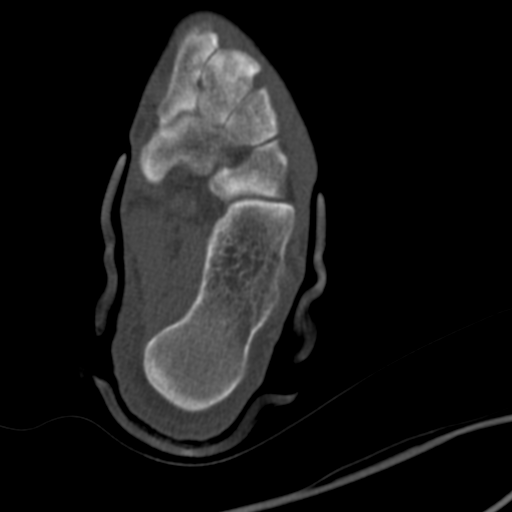
[im 39/102  bone]
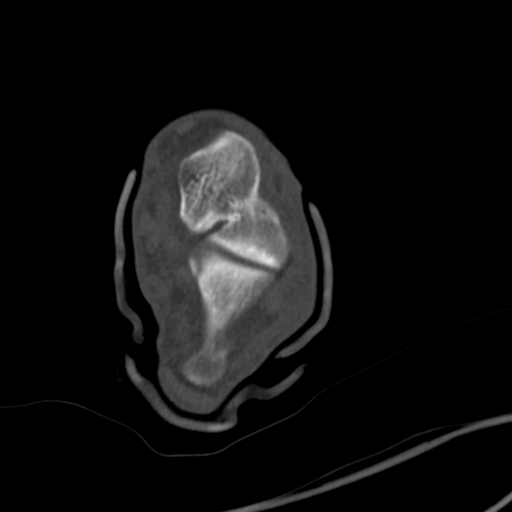
[im 55/102  bone]
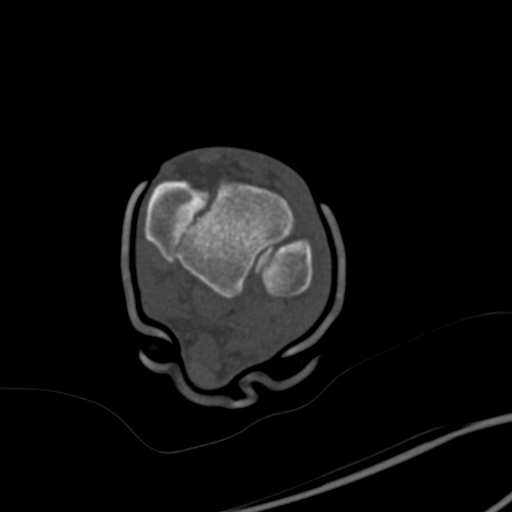
[im 63/102  soft-tissue]
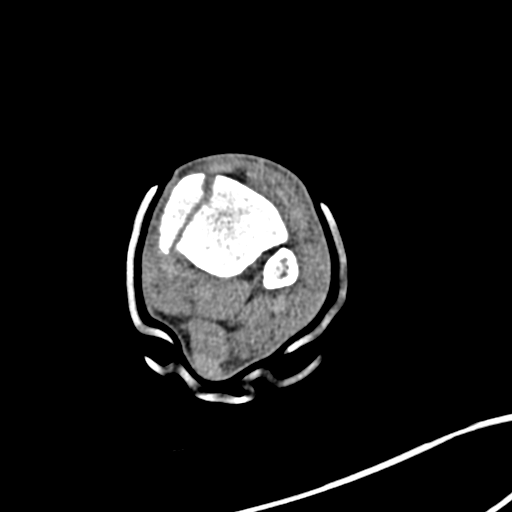
[im 63/102  bone]
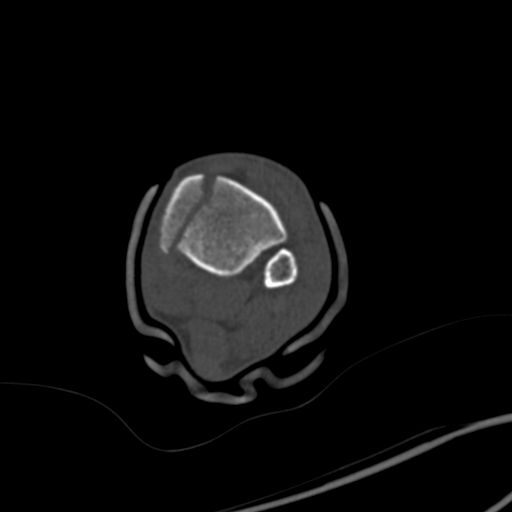
[im 78/102  bone]
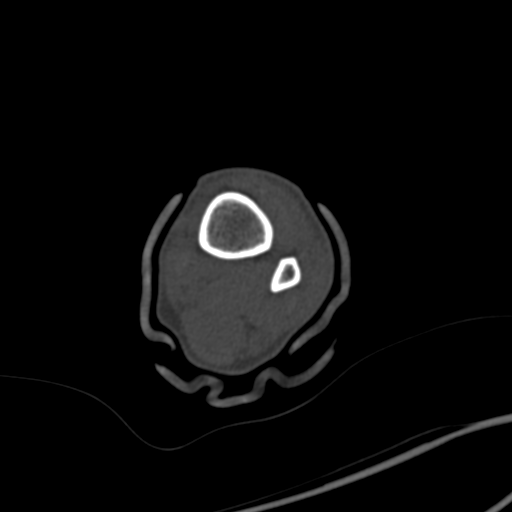
[im 94/102  bone]
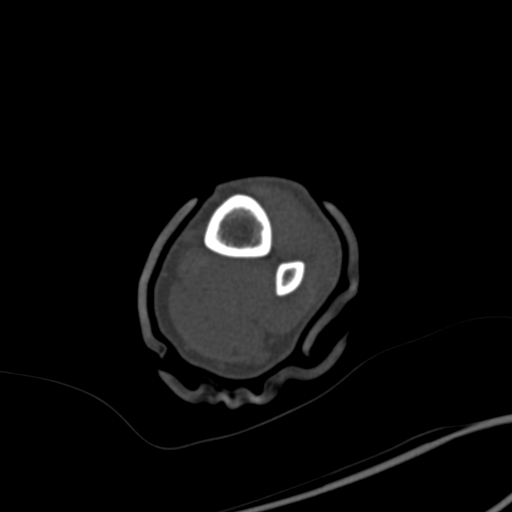

[Series 9: coronal st · coronal · 0.43mm/px · 3 of 95 slices shown]
[im 19/95  bone]
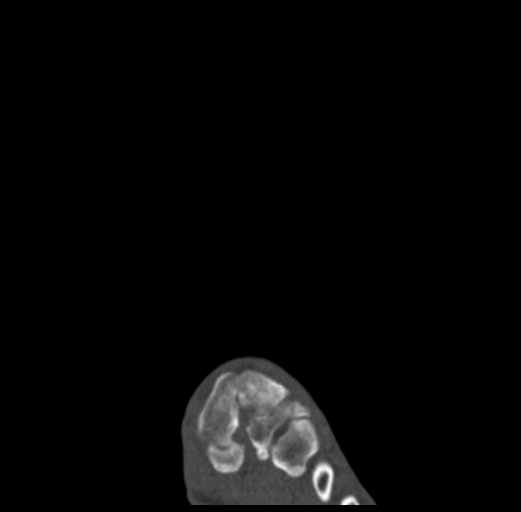
[im 38/95  bone]
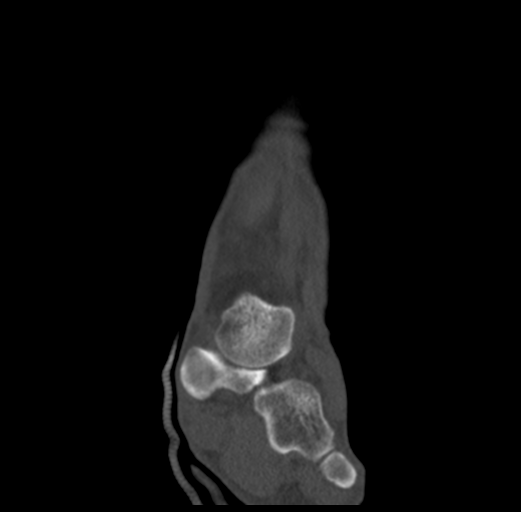
[im 57/95  bone]
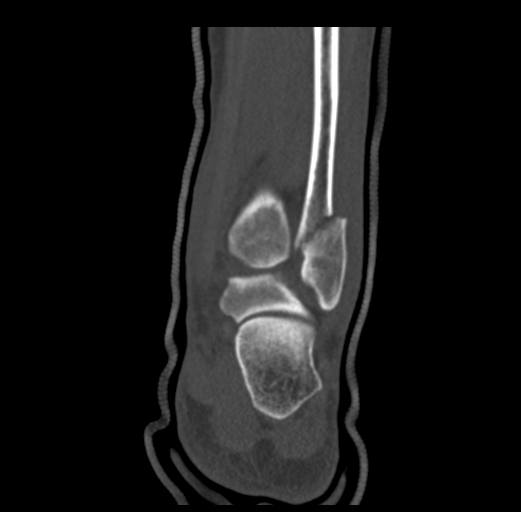

[Series 10: sagittal st · sagittal · 0.37mm/px · 5 of 112 slices shown, 6 images]
[im 38/112  bone]
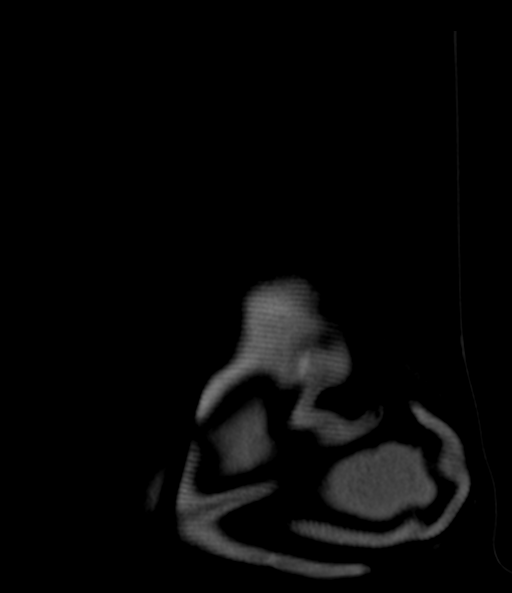
[im 47/112  bone]
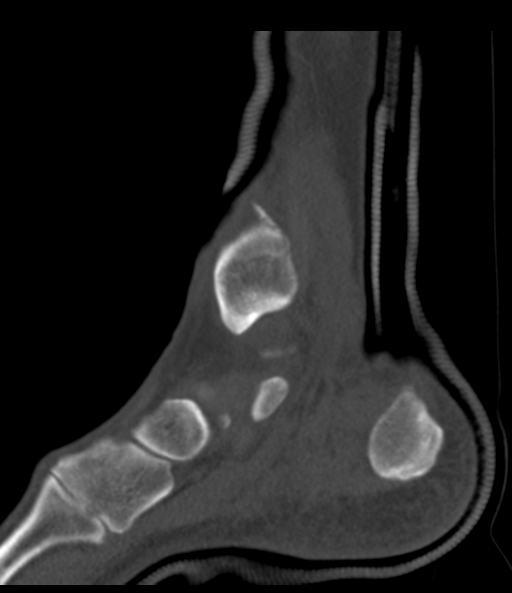
[im 56/112  soft-tissue]
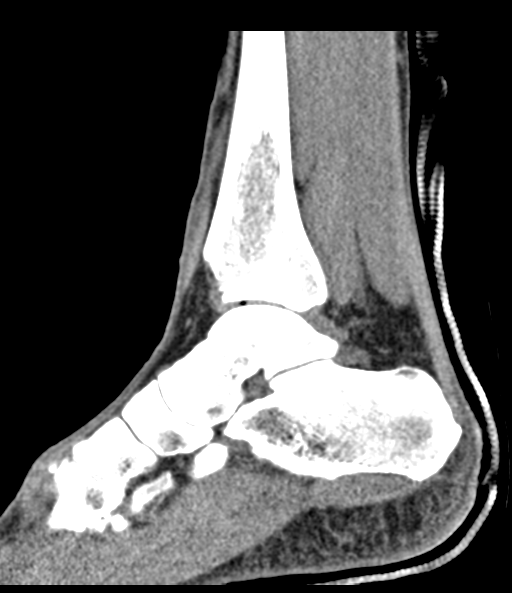
[im 56/112  bone]
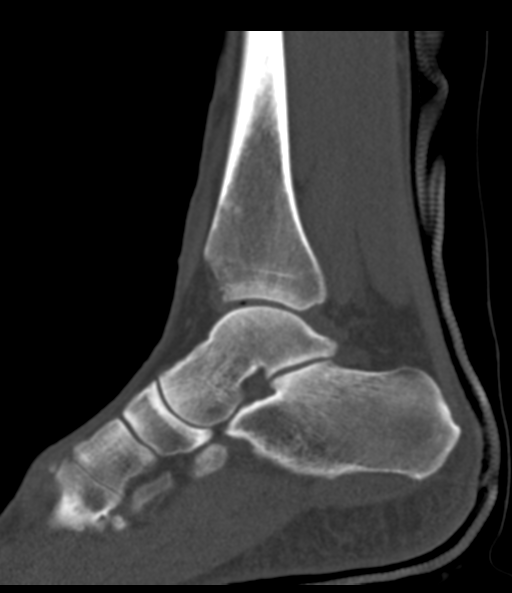
[im 65/112  bone]
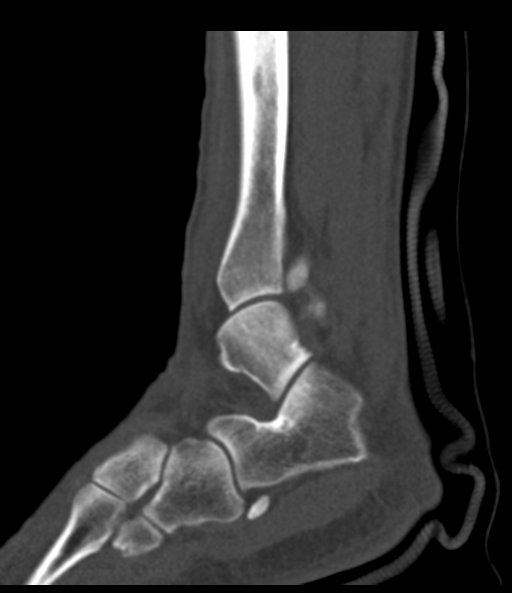
[im 75/112  bone]
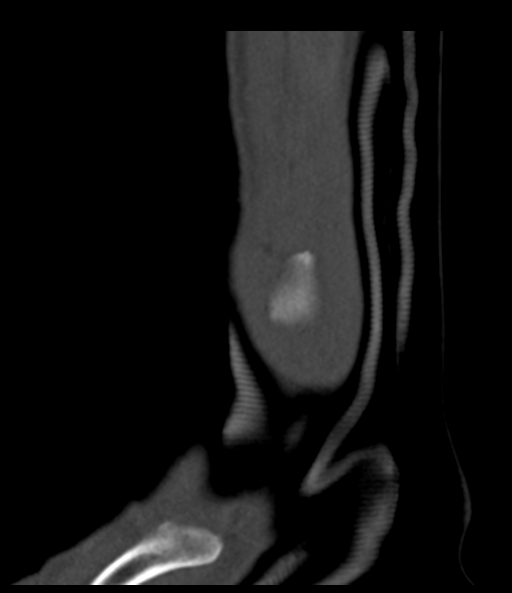

[15 of 33 positions shown; findings below may reference images not displayed]

FINDINGS: Bones/Joint/Cartilage

There is an acute vertically oriented fracture of the medial
malleolus demonstrating minimal comminution, approximately 3-4 mm
superior displacement of the medial malleolus, and up to 5 mm
distraction of the fracture fragments resulting in an articular gap,
articular incongruence and widening of the medial joint space.

There is, additionally, an acute, oblique fracture of the distal
fibular metaphysis extending to the level of the tibial plafond and
demonstrating approximately 4 mm lateral displacement and minimal
override of the distal fracture fragment. Several small ossific
fracture fragments are seen within the a lateral joint space
measuring up to 3 mm.

Normal tibiotalar alignment.  No other fracture identified.

Ligaments

Suboptimally assessed by CT.

Muscles and Tendons

Normal muscle bulk. Achilles, flexor, extensor, and peroneal tendons
are unremarkable.

Soft tissues

Extensive subcutaneous edema is seen superficial to the a malleoli
bilaterally, lateral greater than medial.
IMPRESSION: Bimalleolar ankle fracture, as described above, with articular in
congruence, particularly medially, secondary to superior
displacement of the medial malleolar fracture fragment as well as
small intra-articular fracture fracture fragments within the lateral
joint space.

## 2022-07-24 DIAGNOSIS — M545 Low back pain, unspecified: Secondary | ICD-10-CM | POA: Diagnosis not present

## 2022-07-24 DIAGNOSIS — M5137 Other intervertebral disc degeneration, lumbosacral region: Secondary | ICD-10-CM | POA: Diagnosis not present

## 2022-07-24 DIAGNOSIS — M62830 Muscle spasm of back: Secondary | ICD-10-CM | POA: Diagnosis not present

## 2022-08-20 ENCOUNTER — Ambulatory Visit (INDEPENDENT_AMBULATORY_CARE_PROVIDER_SITE_OTHER): Payer: 59 | Admitting: Family Medicine

## 2022-08-20 ENCOUNTER — Encounter (HOSPITAL_BASED_OUTPATIENT_CLINIC_OR_DEPARTMENT_OTHER): Payer: Self-pay | Admitting: Family Medicine

## 2022-08-20 VITALS — BP 108/68 | HR 66 | Ht 75.0 in | Wt 175.0 lb

## 2022-08-20 DIAGNOSIS — Z7689 Persons encountering health services in other specified circumstances: Secondary | ICD-10-CM | POA: Diagnosis not present

## 2022-08-20 DIAGNOSIS — Z0184 Encounter for antibody response examination: Secondary | ICD-10-CM

## 2022-08-20 DIAGNOSIS — Z87891 Personal history of nicotine dependence: Secondary | ICD-10-CM | POA: Insufficient documentation

## 2022-08-20 DIAGNOSIS — Z1159 Encounter for screening for other viral diseases: Secondary | ICD-10-CM | POA: Diagnosis not present

## 2022-08-20 DIAGNOSIS — C4491 Basal cell carcinoma of skin, unspecified: Secondary | ICD-10-CM | POA: Diagnosis not present

## 2022-08-20 DIAGNOSIS — Z0000 Encounter for general adult medical examination without abnormal findings: Secondary | ICD-10-CM | POA: Diagnosis not present

## 2022-08-20 DIAGNOSIS — Z85828 Personal history of other malignant neoplasm of skin: Secondary | ICD-10-CM

## 2022-08-20 DIAGNOSIS — Z114 Encounter for screening for human immunodeficiency virus [HIV]: Secondary | ICD-10-CM | POA: Diagnosis not present
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
## 2022-08-21 LAB — CBC WITH DIFFERENTIAL/PLATELET
Basophils Absolute: 0 10*3/uL (ref 0.0–0.2)
Basos: 0 %
EOS (ABSOLUTE): 0.1 10*3/uL (ref 0.0–0.4)
Eos: 1 %
Hematocrit: 44.7 % (ref 37.5–51.0)
Hemoglobin: 15.6 g/dL (ref 13.0–17.7)
Immature Grans (Abs): 0 10*3/uL (ref 0.0–0.1)
Immature Granulocytes: 0 %
Lymphocytes Absolute: 1.7 10*3/uL (ref 0.7–3.1)
Lymphs: 25 %
MCH: 33.1 pg — ABNORMAL HIGH (ref 26.6–33.0)
MCHC: 34.9 g/dL (ref 31.5–35.7)
MCV: 95 fL (ref 79–97)
Monocytes Absolute: 0.6 10*3/uL (ref 0.1–0.9)
Monocytes: 8 %
Neutrophils Absolute: 4.6 10*3/uL (ref 1.4–7.0)
Neutrophils: 66 %
Platelets: 262 10*3/uL (ref 150–450)
RBC: 4.71 x10E6/uL (ref 4.14–5.80)
RDW: 11.6 % (ref 11.6–15.4)
WBC: 7 10*3/uL (ref 3.4–10.8)

## 2022-08-21 LAB — LIPID PANEL
Chol/HDL Ratio: 2.6 ratio (ref 0.0–5.0)
Cholesterol, Total: 144 mg/dL (ref 100–199)
HDL: 55 mg/dL (ref >39)
LDL Chol Calc (NIH): 71 mg/dL (ref 0–99)
Triglycerides: 99 mg/dL (ref 0–149)
VLDL Cholesterol Cal: 18 mg/dL (ref 5–40)

## 2022-08-21 LAB — COMPREHENSIVE METABOLIC PANEL
ALT: 34 IU/L (ref 0–44)
AST: 26 IU/L (ref 0–40)
Albumin: 4.4 g/dL (ref 4.1–5.1)
Alkaline Phosphatase: 88 IU/L (ref 44–121)
BUN/Creatinine Ratio: 10 (ref 9–20)
BUN: 13 mg/dL (ref 6–24)
Bilirubin Total: <0.2 mg/dL (ref 0.0–1.2)
CO2: 23 mmol/L (ref 20–29)
Calcium: 9.7 mg/dL (ref 8.7–10.2)
Chloride: 107 mmol/L — ABNORMAL HIGH (ref 96–106)
Creatinine, Ser: 1.34 mg/dL — ABNORMAL HIGH (ref 0.76–1.27)
Globulin, Total: 2.3 g/dL (ref 1.5–4.5)
Glucose: 93 mg/dL (ref 70–99)
Potassium: 4.8 mmol/L (ref 3.5–5.2)
Sodium: 143 mmol/L (ref 134–144)
Total Protein: 6.7 g/dL (ref 6.0–8.5)
eGFR: 66 mL/min/{1.73_m2} (ref >59)

## 2022-08-21 LAB — HEMOGLOBIN A1C
Est. average glucose Bld gHb Est-mCnc: 111 mg/dL
Hgb A1c MFr Bld: 5.5 % (ref 4.8–5.6)

## 2022-08-21 LAB — HCV RNA QUANT RFLX ULTRA OR GENOTYP: HCV Quant Baseline: NOT DETECTED [IU]/mL

## 2022-08-21 LAB — HEPATITIS B CORE AB W/REFLEX: Hep B Core Total Ab: NEGATIVE

## 2022-08-21 LAB — HEPATITIS A ANTIBODY, TOTAL: hep A Total Ab: NEGATIVE

## 2022-08-21 LAB — TSH RFX ON ABNORMAL TO FREE T4: TSH: 3.39 u[IU]/mL (ref 0.450–4.500)

## 2022-08-21 LAB — HIV ANTIBODY (ROUTINE TESTING W REFLEX): HIV Screen 4th Generation wRfx: NONREACTIVE

## 2022-08-29 ENCOUNTER — Encounter (HOSPITAL_BASED_OUTPATIENT_CLINIC_OR_DEPARTMENT_OTHER): Payer: Self-pay

## 2022-09-25 ENCOUNTER — Ambulatory Visit: Payer: 59 | Admitting: Dermatology

## 2022-11-22 ENCOUNTER — Encounter (HOSPITAL_BASED_OUTPATIENT_CLINIC_OR_DEPARTMENT_OTHER): Payer: Self-pay | Admitting: Family Medicine

## 2023-08-30 ENCOUNTER — Telehealth: Payer: Self-pay | Admitting: *Deleted

## 2023-08-30 NOTE — Telephone Encounter (Signed)
 Copied from CRM [id number]. Topic: General - Other >> Aug 30, 2023  2:23 PM Santiya F wrote: Reason for CRM: Patient is calling in because he wants to know if the office knows of any PCP offices that accept his new insurance [employer] through [employer]. Advised patient to reach out to insurance for in network providers. Patient agreed, but still wanted to know if the office had any recommendations.

## 2023-08-30 NOTE — Telephone Encounter (Signed)
 Sean Rodriguez, please advise on this if you know of any offices that accept his new insurance.
